# Patient Record
Sex: Male | Born: 1990 | Race: White | Hispanic: No | Marital: Single | Smoking: Former smoker
Health system: Southern US, Community
[De-identification: ages and names within clinical notes are randomized; demographics above are authoritative.]

## PROBLEM LIST (undated history)

## (undated) DIAGNOSIS — F410 Panic disorder [episodic paroxysmal anxiety] without agoraphobia: Secondary | ICD-10-CM

## (undated) DIAGNOSIS — F1910 Other psychoactive substance abuse, uncomplicated: Secondary | ICD-10-CM

## (undated) DIAGNOSIS — R0789 Other chest pain: Secondary | ICD-10-CM

## (undated) DIAGNOSIS — R002 Palpitations: Secondary | ICD-10-CM

## (undated) DIAGNOSIS — Z87891 Personal history of nicotine dependence: Secondary | ICD-10-CM

## (undated) HISTORY — DX: Other psychoactive substance abuse, uncomplicated: F19.10

## (undated) HISTORY — DX: Other chest pain: R07.89

## (undated) HISTORY — DX: Palpitations: R00.2

## (undated) HISTORY — DX: Panic disorder (episodic paroxysmal anxiety): F41.0

## (undated) HISTORY — DX: Personal history of nicotine dependence: Z87.891

---
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
## 2011-05-21 ENCOUNTER — Encounter: Payer: Self-pay | Admitting: *Deleted

## 2011-05-24 ENCOUNTER — Encounter: Payer: Self-pay | Admitting: Cardiovascular Disease

## 2011-05-24 ENCOUNTER — Ambulatory Visit (INDEPENDENT_AMBULATORY_CARE_PROVIDER_SITE_OTHER): Payer: 59 | Admitting: Cardiovascular Disease

## 2011-05-24 DIAGNOSIS — R002 Palpitations: Secondary | ICD-10-CM | POA: Insufficient documentation

## 2011-05-24 DIAGNOSIS — F1910 Other psychoactive substance abuse, uncomplicated: Secondary | ICD-10-CM

## 2011-05-24 DIAGNOSIS — R079 Chest pain, unspecified: Secondary | ICD-10-CM | POA: Insufficient documentation

## 2011-05-24 DIAGNOSIS — F411 Generalized anxiety disorder: Secondary | ICD-10-CM

## 2011-05-24 DIAGNOSIS — F419 Anxiety disorder, unspecified: Secondary | ICD-10-CM | POA: Insufficient documentation
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
## 2011-05-24 NOTE — Assessment & Plan Note (Signed)
Atypical normal exam and ECG.  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

## 2011-05-24 NOTE — Assessment & Plan Note (Signed)
F/U psychiatric counselor consider oral drug Rx

## 2011-05-24 NOTE — Assessment & Plan Note (Signed)
Given multiple previous substances would appear to be high risk for relapse.  F/U primary and psych
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
## 2011-05-24 NOTE — Patient Instructions (Signed)
Your physician has requested that you have an echocardiogram. Echocardiography is a painless test that uses sound waves to create images of your heart. It provides your doctor with information about the size and shape of your heart and how well your heart's chambers and valves are working. This procedure takes approximately one hour. There are no restrictions for this procedure.  Your physician has recommended that you wear an event monitor. Event monitors are medical devices that record the heart's electrical activity. Doctors most often us these monitors to diagnose arrhythmias. Arrhythmias are problems with the speed or rhythm of the heartbeat. The monitor is a small, portable device. You can wear one while you do your normal daily activities. This is usually used to diagnose what is causing palpitations/syncope (passing out).   

## 2011-05-28 ENCOUNTER — Ambulatory Visit (HOSPITAL_COMMUNITY): Payer: 59 | Attending: Cardiovascular Disease | Admitting: Radiology

## 2011-05-28 ENCOUNTER — Encounter (INDEPENDENT_AMBULATORY_CARE_PROVIDER_SITE_OTHER): Payer: 59

## 2011-05-28 DIAGNOSIS — R072 Precordial pain: Secondary | ICD-10-CM | POA: Insufficient documentation

## 2011-05-28 DIAGNOSIS — R002 Palpitations: Secondary | ICD-10-CM | POA: Insufficient documentation

## 2011-05-28 DIAGNOSIS — I059 Rheumatic mitral valve disease, unspecified: Secondary | ICD-10-CM | POA: Insufficient documentation

## 2011-05-28 DIAGNOSIS — I079 Rheumatic tricuspid valve disease, unspecified: Secondary | ICD-10-CM | POA: Insufficient documentation

## 2011-06-03 NOTE — Progress Notes (Signed)
pt aware of results Mario Crawford  

## 2011-06-28 ENCOUNTER — Telehealth: Payer: Self-pay | Admitting: *Deleted

## 2011-06-28 NOTE — Telephone Encounter (Signed)
Spoke with pt, aware monitor reviewed by dr Eden Emms shows sinus with PAC's, no sig arrythmia Mario Crawford

## 2011-07-06 ENCOUNTER — Telehealth: Payer: Self-pay | Admitting: Cardiovascular Disease

## 2011-07-06 NOTE — Telephone Encounter (Signed)
LOV faxed to [facility] @ [phone]   07/06/11/km

## 2011-07-25 ENCOUNTER — Emergency Department (HOSPITAL_COMMUNITY): Payer: 59

## 2011-07-25 ENCOUNTER — Emergency Department (HOSPITAL_COMMUNITY)
Admission: EM | Admit: 2011-07-25 | Discharge: 2011-07-26 | Disposition: A | Discharge status: Home / Self Care | Payer: 59 | Attending: Emergency Medicine | Admitting: Emergency Medicine

## 2011-07-25 DIAGNOSIS — Y998 Other external cause status: Secondary | ICD-10-CM | POA: Insufficient documentation

## 2011-07-25 DIAGNOSIS — F411 Generalized anxiety disorder: Secondary | ICD-10-CM | POA: Insufficient documentation

## 2011-07-25 DIAGNOSIS — R51 Headache: Secondary | ICD-10-CM | POA: Insufficient documentation

## 2011-07-25 DIAGNOSIS — IMO0002 Reserved for concepts with insufficient information to code with codable children: Secondary | ICD-10-CM | POA: Insufficient documentation

## 2011-07-25 DIAGNOSIS — S0990XA Unspecified injury of head, initial encounter: Secondary | ICD-10-CM | POA: Insufficient documentation

## 2011-07-25 DIAGNOSIS — Y92410 Unspecified street and highway as the place of occurrence of the external cause: Secondary | ICD-10-CM | POA: Insufficient documentation

## 2013-01-27 ENCOUNTER — Emergency Department (HOSPITAL_COMMUNITY)
Admission: EM | Admit: 2013-01-27 | Discharge: 2013-01-27 | Disposition: A | Discharge status: Home / Self Care | Payer: 59 | Attending: Emergency Medicine | Admitting: Emergency Medicine

## 2013-01-27 ENCOUNTER — Encounter (HOSPITAL_COMMUNITY): Payer: Self-pay | Admitting: Emergency Medicine

## 2013-01-27 DIAGNOSIS — R21 Rash and other nonspecific skin eruption: Secondary | ICD-10-CM | POA: Insufficient documentation

## 2013-01-27 DIAGNOSIS — F411 Generalized anxiety disorder: Secondary | ICD-10-CM | POA: Insufficient documentation

## 2013-01-27 DIAGNOSIS — R198 Other specified symptoms and signs involving the digestive system and abdomen: Secondary | ICD-10-CM | POA: Insufficient documentation

## 2013-01-27 DIAGNOSIS — R1902 Left upper quadrant abdominal swelling, mass and lump: Secondary | ICD-10-CM | POA: Insufficient documentation

## 2013-01-27 DIAGNOSIS — Z87891 Personal history of nicotine dependence: Secondary | ICD-10-CM | POA: Insufficient documentation

## 2013-01-27 DIAGNOSIS — Z880 Allergy status to penicillin: Secondary | ICD-10-CM | POA: Insufficient documentation

## 2013-01-27 DIAGNOSIS — R0602 Shortness of breath: Secondary | ICD-10-CM | POA: Insufficient documentation

## 2013-01-27 DIAGNOSIS — R079 Chest pain, unspecified: Secondary | ICD-10-CM | POA: Insufficient documentation

## 2013-01-27 DIAGNOSIS — R5381 Other malaise: Secondary | ICD-10-CM | POA: Insufficient documentation

## 2013-01-27 DIAGNOSIS — Z711 Person with feared health complaint in whom no diagnosis is made: Secondary | ICD-10-CM

## 2013-01-27 DIAGNOSIS — Z8679 Personal history of other diseases of the circulatory system: Secondary | ICD-10-CM | POA: Insufficient documentation

## 2013-01-27 DIAGNOSIS — R110 Nausea: Secondary | ICD-10-CM | POA: Insufficient documentation

## 2013-01-27 LAB — CBC WITH DIFFERENTIAL/PLATELET
Eosinophils Absolute: 0.1 10*3/uL (ref 0.0–0.7)
Lymphocytes Relative: 19 % (ref 12–46)
Lymphs Abs: 1.4 10*3/uL (ref 0.7–4.0)
MCH: 31.1 pg (ref 26.0–34.0)
Neutrophils Relative %: 72 % (ref 43–77)
Platelets: 251 10*3/uL (ref 150–400)
RBC: 4.82 MIL/uL (ref 4.22–5.81)
WBC: 7.4 10*3/uL (ref 4.0–10.5)

## 2013-01-27 LAB — COMPREHENSIVE METABOLIC PANEL
ALT: 16 U/L (ref 0–53)
Albumin: 4.5 g/dL (ref 3.5–5.2)
Alkaline Phosphatase: 69 U/L (ref 39–117)
Calcium: 10.5 mg/dL (ref 8.4–10.5)
Potassium: 3.7 mEq/L (ref 3.5–5.1)
Sodium: 140 mEq/L (ref 135–145)
Total Protein: 8.2 g/dL (ref 6.0–8.3)

## 2013-01-27 NOTE — ED Provider Notes (Signed)
History     CSN: [id number]  Arrival date & time 01/27/13  0728   First MD Initiated Contact with Patient 01/27/13 [phone]      Chief Complaint  Patient presents with  . Multiple Complaints   . Wants to be checked for leukemia     (Consider location/radiation/quality/duration/timing/severity/associated sxs/prior treatment) HPI Patient states he thinks he has leukemia because his gums started having gums bleed daily and luq pressure with increased fatigue for several months.  States he found red purple bump luq today.  Denies trauma.  Patient lives in [place] on [place] [facility] from [place].  States he has pmd oot.  States bp is often high when he presents to ed but no bp meds ever given.   Past Medical History  Diagnosis Date  . Palpitations   . Atypical chest pain   . Panic attacks   . Drug abuse   . Former smoker     No past surgical history on file.  No family history on file.  History  Substance Use Topics  . Smoking status: Former Games developer  . Smokeless tobacco: Never Used  . Alcohol Use: No   Occasional etoh,    Review of Systems  Constitutional: Negative.   HENT: Negative for congestion.   Eyes: Negative.   Respiratory: Positive for shortness of breath.   Cardiovascular: Positive for chest pain. Negative for leg swelling.  Gastrointestinal: Positive for nausea. Negative for vomiting and diarrhea.  Endocrine: Negative.   Genitourinary: Negative.   Skin: Positive for rash.  Allergic/Immunologic: Negative.   Neurological: Negative.     Allergies  Penicillins  Home Medications   Current Outpatient Rx  Name  Route  Sig  Dispense  Refill  . acetaminophen (TYLENOL) 325 MG tablet   Oral   Take 650 mg by mouth every 6 (six) hours as needed.             BP 167/88  Pulse 81  Temp(Src) 98.4 F (36.9 C) (Oral)  Resp 18  SpO2 100%  Physical Exam  Nursing note and vitals reviewed. Constitutional: He is oriented to person, place, and time. He appears  well-developed and well-nourished.  HENT:  Head: Normocephalic and atraumatic.  Right Ear: External ear normal.  Left Ear: External ear normal.  Nose: Nose normal.  Mouth/Throat: Oropharynx is clear and moist.  Eyes: Conjunctivae and EOM are normal. Pupils are equal, round, and reactive to light.  Neck: Normal range of motion. Neck supple.  Cardiovascular: Normal rate, regular rhythm, normal heart sounds and intact distal pulses.   Pulmonary/Chest: Effort normal and breath sounds normal.  Abdominal: Soft. Bowel sounds are normal. He exhibits no distension and no mass. There is no tenderness. There is no rebound.  Musculoskeletal: Normal range of motion.  Neurological: He is alert and oriented to person, place, and time.  Skin: Skin is warm and dry. No rash noted.  Psychiatric: His mood appears anxious.    ED Course  Procedures (including critical care time)  Labs Reviewed - No data to display No results found.   No diagnosis found.    MDM  9:12 AM      Hilario Quarry, MD 01/27/13 [phone]

## 2013-01-27 NOTE — ED Notes (Signed)
Pt escorted to discharge window. Verbalized understanding discharge instructions. In no acute distress. Vitals reviewed and WDL.  

## 2013-01-27 NOTE — ED Notes (Signed)
Pt states that he thinks he has leukemia.  States that for the past couple months he has had gum bleeding everyday.  States that nothing provokes it.  States he feels fatigued, has muscle and joint aches.  "I have pressure in my spleen area".  Also states there is a "purple/red dot on his stomach".

## 2014-03-29 ENCOUNTER — Ambulatory Visit: Payer: Self-pay | Admitting: Internal Medicine

## 2014-03-30 LAB — GC/CHLAMYDIA PROBE AMP

## 2018-07-03 ENCOUNTER — Emergency Department (HOSPITAL_COMMUNITY): Payer: Self-pay

## 2018-07-03 ENCOUNTER — Emergency Department (HOSPITAL_COMMUNITY)
Admission: EM | Admit: 2018-07-03 | Discharge: 2018-07-03 | Disposition: A | Discharge status: Home / Self Care | Payer: Self-pay | Attending: Emergency Medicine | Admitting: Emergency Medicine

## 2018-07-03 ENCOUNTER — Encounter (HOSPITAL_COMMUNITY): Payer: Self-pay

## 2018-07-03 DIAGNOSIS — Y939 Activity, unspecified: Secondary | ICD-10-CM | POA: Insufficient documentation

## 2018-07-03 DIAGNOSIS — S1081XA Abrasion of other specified part of neck, initial encounter: Secondary | ICD-10-CM | POA: Insufficient documentation

## 2018-07-03 DIAGNOSIS — Z87891 Personal history of nicotine dependence: Secondary | ICD-10-CM | POA: Insufficient documentation

## 2018-07-03 DIAGNOSIS — Z79899 Other long term (current) drug therapy: Secondary | ICD-10-CM | POA: Insufficient documentation

## 2018-07-03 DIAGNOSIS — S0083XA Contusion of other part of head, initial encounter: Secondary | ICD-10-CM | POA: Insufficient documentation

## 2018-07-03 DIAGNOSIS — Y929 Unspecified place or not applicable: Secondary | ICD-10-CM | POA: Insufficient documentation

## 2018-07-03 DIAGNOSIS — Y999 Unspecified external cause status: Secondary | ICD-10-CM | POA: Insufficient documentation

## 2018-07-03 DIAGNOSIS — S199XXA Unspecified injury of neck, initial encounter: Secondary | ICD-10-CM | POA: Insufficient documentation

## 2018-07-03 DIAGNOSIS — T07XXXA Unspecified multiple injuries, initial encounter: Secondary | ICD-10-CM

## 2018-07-03 LAB — CBC WITH DIFFERENTIAL/PLATELET
BASOS ABS: 0 10*3/uL (ref 0.0–0.1)
BASOS PCT: 0 %
EOS PCT: 1 %
Eosinophils Absolute: 0.2 10*3/uL (ref 0.0–0.7)
HCT: 45.7 % (ref 39.0–52.0)
HEMOGLOBIN: 16.3 g/dL (ref 13.0–17.0)
LYMPHS ABS: 1.6 10*3/uL (ref 0.7–4.0)
Lymphocytes Relative: 12 %
MCH: 31.8 pg (ref 26.0–34.0)
MCHC: 35.7 g/dL (ref 30.0–36.0)
MCV: 89.1 fL (ref 78.0–100.0)
Monocytes Absolute: 0.7 10*3/uL (ref 0.1–1.0)
Monocytes Relative: 5 %
NEUTROS PCT: 82 %
Neutro Abs: 11.5 10*3/uL — ABNORMAL HIGH (ref 1.7–7.7)
PLATELETS: 256 10*3/uL (ref 150–400)
RBC: 5.13 MIL/uL (ref 4.22–5.81)
RDW: 12.1 % (ref 11.5–15.5)
WBC: 14 10*3/uL — AB (ref 4.0–10.5)

## 2018-07-03 LAB — I-STAT CHEM 8, ED
BUN: 8 mg/dL (ref 6–20)
CALCIUM ION: 1.12 mmol/L — AB (ref 1.15–1.40)
Chloride: 103 mmol/L (ref 98–111)
Creatinine, Ser: 1.2 mg/dL (ref 0.61–1.24)
Glucose, Bld: 105 mg/dL — ABNORMAL HIGH (ref 70–99)
HEMATOCRIT: 48 % (ref 39.0–52.0)
Hemoglobin: 16.3 g/dL (ref 13.0–17.0)
POTASSIUM: 3.5 mmol/L (ref 3.5–5.1)
SODIUM: 141 mmol/L (ref 135–145)
TCO2: 24 mmol/L (ref 22–32)

## 2018-07-03 MED ORDER — SODIUM CHLORIDE 0.9 % IJ SOLN
INTRAMUSCULAR | Status: AC
  Filled 2018-07-03: qty 50

## 2018-07-03 MED ORDER — IOPAMIDOL (ISOVUE-370) INJECTION 76%
INTRAVENOUS | Status: AC
  Filled 2018-07-03: qty 100

## 2018-07-03 MED ORDER — DICLOFENAC SODIUM ER 100 MG PO TB24: 100.0000 mg | ORAL_TABLET | Freq: Every day | ORAL | 0 refills | Status: DC

## 2018-07-03 MED ORDER — METHOCARBAMOL 500 MG PO TABS: 500.0000 mg | ORAL_TABLET | Freq: Two times a day (BID) | ORAL | 0 refills | Status: DC

## 2018-07-03 MED ORDER — KETOROLAC TROMETHAMINE 30 MG/ML IJ SOLN
15.0000 mg | Freq: Once | INTRAMUSCULAR | Status: AC
  Administered 2018-07-03: 15 mg via INTRAVENOUS
  Filled 2018-07-03: qty 1

## 2018-07-03 MED ORDER — ACETAMINOPHEN 500 MG PO TABS
1000.0000 mg | ORAL_TABLET | Freq: Once | ORAL | Status: AC
  Administered 2018-07-03: 1000 mg via ORAL
  Filled 2018-07-03: qty 2

## 2018-07-03 MED ORDER — METHOCARBAMOL 500 MG PO TABS
1000.0000 mg | ORAL_TABLET | Freq: Once | ORAL | Status: AC
  Administered 2018-07-03: 1000 mg via ORAL
  Filled 2018-07-03: qty 2

## 2018-07-03 MED ORDER — IOPAMIDOL (ISOVUE-370) INJECTION 76%
100.0000 mL | Freq: Once | INTRAVENOUS | Status: AC | PRN
  Administered 2018-07-03: 100 mL via INTRAVENOUS
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
## 2018-07-03 NOTE — ED Notes (Signed)
[facility] planning/management officer called for pt

## 2019-04-10 ENCOUNTER — Telehealth: Payer: Self-pay | Admitting: General Practice

## 2019-04-10 ENCOUNTER — Telehealth: Payer: Self-pay | Admitting: Family

## 2019-04-10 DIAGNOSIS — Z20822 Contact with and (suspected) exposure to covid-19: Secondary | ICD-10-CM

## 2019-04-10 NOTE — Addendum Note (Signed)
Addended by: Denman George on: 04/10/2019 09:55 AM   Modules accepted: Orders

## 2019-04-10 NOTE — Progress Notes (Signed)
E-Visit for Corona Virus Screening    Your current symptoms could be consistent with the coronavirus.  Call your health care provider or local health department to request and arrange formal testing. Many health care providers can now test patients at their office but not all are.  Please quarantine yourself while awaiting your test results.  [facility] [phone], [facility] [phone], [facility] [phone] or visit [email or web address]    I will have someone call and schedule testing for you. They will be in touch   COVID-19 is a respiratory illness with symptoms that are similar to the flu. Symptoms are typically mild to moderate, but there have been cases of severe illness and death due to the virus. The following symptoms may appear 2-14 days after exposure: . Fever . Cough . Shortness of breath or difficulty breathing . Chills . Repeated shaking with chills . Muscle pain . Headache . Sore throat . New loss of taste or smell . Fatigue . Congestion or runny nose . Nausea or vomiting . Diarrhea  It is vitally important that if you feel that you have an infection such as this virus or any other virus that you stay home and away from places where you may spread it to others.  You should self-quarantine for 14 days if you have symptoms that could potentially be coronavirus or have been in close contact a with a person diagnosed with COVID-19 within the last 2 weeks. You should avoid contact with people age 28 and older.   You should wear a mask or cloth face covering over your nose and mouth if you must be around other people or animals, including pets (even at home). Try to stay at least 6 feet away from other people. This will protect the people around you.  You may also take acetaminophen (Tylenol) as needed for fever.   Reduce your risk of any infection  by using the same precautions used for avoiding the common cold or flu:  Marland Kitchen. Wash your hands often with soap and warm water for at least 20 seconds.  If soap and water are not readily available, use an alcohol-based hand sanitizer with at least 60% alcohol.  . If coughing or sneezing, cover your mouth and nose by coughing or sneezing into the elbow areas of your shirt or coat, into a tissue or into your sleeve (not your hands). . Avoid shaking hands with others and consider head nods or verbal greetings only. . Avoid touching your eyes, nose, or mouth with unwashed hands.  . Avoid close contact with people who are sick. . Avoid places or events with large numbers of people in one location, like concerts or sporting events. . Carefully consider travel plans you have or are making. . If you are planning any travel outside or inside the [place], visit the CDC's Travelers' Health webpage for the latest health notices. . If you have some symptoms but not all symptoms, continue to monitor at home and seek medical attention if your symptoms worsen. . If you are having a medical emergency, call 911.  HOME CARE . Only take medications as instructed by your medical team. . Drink plenty of fluids and get plenty of rest. . A steam or ultrasonic humidifier can help if you have congestion.   GET HELP RIGHT AWAY IF YOU HAVE EMERGENCY WARNING SIGNS** FOR COVID-19. If you or someone is showing any of these signs seek emergency medical care immediately. Call 911  or proceed to your closest emergency facility if: . You develop worsening high fever. . Trouble breathing . Bluish lips or face . Persistent pain or pressure in the chest . New confusion . Inability to wake or stay awake . You cough up blood. . Your symptoms become more severe  **This list is not all possible symptoms. Contact your medical provider for any symptoms that are sever or concerning to you.   MAKE SURE YOU   Understand these  instructions.  Will watch your condition.  Will get help right away if you are not doing well or get worse.  Your e-visit answers were reviewed by a board certified advanced clinical practitioner to complete your personal care plan.  Depending on the condition, your plan could have included both over the counter or prescription medications.  If there is a problem please reply once you have received a response from your provider.  Your safety is important to [place].  If you have drug allergies check your prescription carefully.    You can use MyChart to ask questions about today's visit, request a non-urgent call back, or ask for a work or school excuse for 24 hours related to this e-Visit. If it has been greater than 24 hours you will need to follow up with your provider, or enter a new e-Visit to address those concerns. You will get an e-mail in the next two days asking about your experience.  I hope that your e-visit has been valuable and will speed your recovery. Thank you for using e-visits.   Greater than 5 minutes, yet less than 10 minutes of time have been spent researching, coordinating, and implementing care for this patient today.  Thank you for the details you included in the comment boxes. Those details are very helpful in determining the best course of treatment for you and help [place] to provide the best care.

## 2019-04-10 NOTE — Telephone Encounter (Signed)
Pt has been scheduled for Covid testing. Pt was referred by: Kennyth Arnold, FNP Pt has been scheduled at [facility] testing site.

## 2019-04-11 ENCOUNTER — Other Ambulatory Visit: Payer: Self-pay

## 2019-04-11 ENCOUNTER — Other Ambulatory Visit: Payer: Self-pay | Admitting: Internal Medicine

## 2019-04-11 DIAGNOSIS — Z20822 Contact with and (suspected) exposure to covid-19: Secondary | ICD-10-CM

## 2019-04-14 LAB — NOVEL CORONAVIRUS, NAA: SARS-CoV-2, NAA: NOT DETECTED
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
## 2019-07-18 DIAGNOSIS — Z1159 Encounter for screening for other viral diseases: Secondary | ICD-10-CM | POA: Diagnosis not present

## 2019-07-18 DIAGNOSIS — Z114 Encounter for screening for human immunodeficiency virus [HIV]: Secondary | ICD-10-CM | POA: Diagnosis not present

## 2019-07-18 DIAGNOSIS — N6325 Unspecified lump in the left breast, overlapping quadrants: Secondary | ICD-10-CM | POA: Diagnosis not present

## 2019-07-18 DIAGNOSIS — Z23 Encounter for immunization: Secondary | ICD-10-CM | POA: Diagnosis not present

## 2019-07-24 ENCOUNTER — Ambulatory Visit: Payer: Self-pay | Admitting: Registered Nurse

## 2019-08-03 DIAGNOSIS — J029 Acute pharyngitis, unspecified: Secondary | ICD-10-CM | POA: Diagnosis not present

## 2019-08-03 DIAGNOSIS — Z7189 Other specified counseling: Secondary | ICD-10-CM | POA: Diagnosis not present

## 2019-08-03 DIAGNOSIS — Z03818 Encounter for observation for suspected exposure to other biological agents ruled out: Secondary | ICD-10-CM | POA: Diagnosis not present

## 2019-08-03 DIAGNOSIS — R05 Cough: Secondary | ICD-10-CM | POA: Diagnosis not present

## 2019-08-03 DIAGNOSIS — Z20828 Contact with and (suspected) exposure to other viral communicable diseases: Secondary | ICD-10-CM | POA: Diagnosis not present

## 2019-09-04 DIAGNOSIS — Z03818 Encounter for observation for suspected exposure to other biological agents ruled out: Secondary | ICD-10-CM | POA: Diagnosis not present

## 2019-10-03 ENCOUNTER — Ambulatory Visit: Payer: Self-pay | Attending: Internal Medicine

## 2019-10-04 DIAGNOSIS — Z03818 Encounter for observation for suspected exposure to other biological agents ruled out: Secondary | ICD-10-CM | POA: Diagnosis not present

## 2019-10-12 DIAGNOSIS — R05 Cough: Secondary | ICD-10-CM | POA: Diagnosis not present

## 2019-10-12 DIAGNOSIS — Z03818 Encounter for observation for suspected exposure to other biological agents ruled out: Secondary | ICD-10-CM | POA: Diagnosis not present

## 2019-12-14 ENCOUNTER — Ambulatory Visit: Payer: Self-pay | Attending: Internal Medicine

## 2019-12-14 DIAGNOSIS — Z23 Encounter for immunization: Secondary | ICD-10-CM

## 2019-12-14 NOTE — Progress Notes (Signed)
   Covid-19 Vaccination Clinic  Name:  GRANTLEY SAVAGE    MRN: [id number] DOB: 1991-03-08  12/14/2019  Mr. Deshmukh was observed post Covid-19 immunization for 15 minutes without incident. He was provided with Vaccine Information Sheet and instruction to access the V-Safe system.   Mr. Smiles was instructed to call 911 with any severe reactions post vaccine: Marland Kitchen Difficulty breathing  . Swelling of face and throat  . A fast heartbeat  . A bad rash all over body  . Dizziness and weakness   Immunizations Administered    Name Date Dose VIS Date Route   Pfizer COVID-19 Vaccine 12/14/2019  2:28 PM 0.3 mL 09/07/2019 Intramuscular   Manufacturer: [employer], [employer]   Lot: [id number]   NDC: [address]-1

## 2019-12-18 DIAGNOSIS — R05 Cough: Secondary | ICD-10-CM | POA: Diagnosis not present

## 2020-01-08 ENCOUNTER — Ambulatory Visit: Payer: Self-pay | Attending: Internal Medicine

## 2020-01-08 DIAGNOSIS — Z23 Encounter for immunization: Secondary | ICD-10-CM

## 2020-01-08 NOTE — Progress Notes (Signed)
   Covid-19 Vaccination Clinic  Name:  Mario Crawford    MRN: [id number] DOB: March 10, 1991  01/08/2020  [name] was observed post Covid-19 immunization for 15 minutes without incident. He was provided with Vaccine Information Sheet and instruction to access the V-Safe system.   [name] was instructed to call 911 with any severe reactions post vaccine: Marland Kitchen Difficulty breathing  . Swelling of face and throat  . A fast heartbeat  . A bad rash all over body  . Dizziness and weakness   Immunizations Administered    Name Date Dose VIS Date Route   Pfizer COVID-19 Vaccine 01/08/2020  3:31 PM 0.3 mL 09/07/2019 Intramuscular   Manufacturer: [employer], [employer]   Lot: [id number]   NDC: [address]-8

## 2020-01-17 DIAGNOSIS — D2272 Melanocytic nevi of left lower limb, including hip: Secondary | ICD-10-CM | POA: Diagnosis not present

## 2020-01-17 DIAGNOSIS — L309 Dermatitis, unspecified: Secondary | ICD-10-CM | POA: Diagnosis not present

## 2020-01-17 DIAGNOSIS — D2271 Melanocytic nevi of right lower limb, including hip: Secondary | ICD-10-CM | POA: Diagnosis not present

## 2020-03-01 ENCOUNTER — Ambulatory Visit (HOSPITAL_COMMUNITY)
Admission: EM | Admit: 2020-03-01 | Discharge: 2020-03-01 | Disposition: A | Discharge status: Home / Self Care | Payer: BC Managed Care – PPO | Attending: Family Medicine | Admitting: Family Medicine

## 2020-03-01 ENCOUNTER — Other Ambulatory Visit: Payer: Self-pay

## 2020-03-01 ENCOUNTER — Encounter (HOSPITAL_COMMUNITY): Payer: Self-pay

## 2020-03-01 DIAGNOSIS — M7061 Trochanteric bursitis, right hip: Secondary | ICD-10-CM | POA: Diagnosis not present

## 2020-03-01 MED ORDER — PREDNISONE 10 MG (21) PO TBPK: ORAL_TABLET | Freq: Every day | ORAL | 0 refills | Status: DC

## 2020-03-01 NOTE — ED Triage Notes (Signed)
Pt c/o 4/10 dull righ thip pain and clicking in his right knee. Pt states he started running 3 wks ago for exercise. Pt was able to walk to exam room.

## 2020-03-03 ENCOUNTER — Encounter: Payer: Self-pay | Admitting: Family Medicine

## 2020-03-03 ENCOUNTER — Other Ambulatory Visit: Payer: Self-pay

## 2020-03-03 ENCOUNTER — Ambulatory Visit: Payer: BC Managed Care – PPO | Admitting: Family Medicine

## 2020-03-03 DIAGNOSIS — M25561 Pain in right knee: Secondary | ICD-10-CM | POA: Insufficient documentation

## 2020-03-03 DIAGNOSIS — M25512 Pain in left shoulder: Secondary | ICD-10-CM

## 2020-03-03 NOTE — ED Provider Notes (Signed)
[facility]   [id number] 03/01/20 Arrival Time: 1446  ASSESSMENT & PLAN:  1. Trochanteric bursitis of right hip     No indication for imaging at this time.  Begin: Meds ordered this encounter  Medications  . predniSONE (STERAPRED UNI-PAK 21 TAB) 10 MG (21) TBPK tablet    Sig: Take by mouth daily. Take as directed.    Dispense:  21 tablet    Refill:  0    Recommend: Follow-up Information    [facility].   Why: If worsening or failing to improve as anticipated. Contact information: [address] [place] [address] [phone]          Reviewed expectations re: course of current medical issues. Questions answered. Outlined signs and symptoms indicating need for more acute intervention. Patient verbalized understanding. After Visit Summary given.  SUBJECTIVE: History from: patient. SIMCHA SPEIR is a 29 y.o. male who reports fairly persistent moderate pain of his right hip; described as aching; without radiation. Onset: gradual. First noted: over the past 1-2 weeks; has started running approx 3 weeks ago. No trauma. Symptoms have gradually worsened since beginning. Aggravating factors: have not been identified. Alleviating factors: rest. Associated symptoms: none reported. Extremity sensation changes or weakness: none. Self treatment: NSAID without much relief. History of similar: no. Also has noticed "some clicking" in his R knee; no locking or giving out of knee.  History reviewed. No pertinent surgical history.    OBJECTIVE:  Vitals:   03/01/20 1525 03/01/20 1526  BP: 139/68   Pulse: 62   Resp: 16   Temp: 98.5 F (36.9 C)   TempSrc: Oral   SpO2: 99%   Weight:  81.6 kg  Height:  5\' 7"  (1.702 m)    General appearance: alert; no distress HEENT: [place]; AT Neck: supple with FROM Resp: unlabored respirations Extremities: . RLE: warm with well perfused appearance; fairly well localized  moderate tenderness over right trochanter; without gross deformities; swelling: none; bruising: none; hip ROM: normal Skin: warm and dry; no visible rashes Neurologic: gait normal; normal sensation and strength of RLE Psychological: alert and cooperative; normal mood and affect     Allergies  Allergen Reactions  . Penicillins Anaphylaxis    Has patient had a PCN reaction causing immediate rash, facial/tongue/throat swelling, SOB or lightheadedness with hypotension: Yes Has patient had a PCN reaction causing severe rash involving mucus membranes or skin necrosis: No Has patient had a PCN reaction that required hospitalization: Yes Has patient had a PCN reaction occurring within the last 10 years: No If all of the above answers are "NO", then may proceed with Cephalosporin use.    Past Medical History:  Diagnosis Date  . Atypical chest pain   . Drug abuse (HCC)   . Former smoker   . Palpitations   . Panic attacks    Social History   Socioeconomic History  . Marital status: Single    Spouse name: Not on file  . Number of children: Not on file  . Years of education: Not on file  . Highest education level: Not on file  Occupational History  . Occupation: : [employer]  Tobacco Use  . Smoking status: Former Dentist  . Smokeless tobacco: Never Used  Substance and Sexual Activity  . Alcohol use: Yes    Comment: occ  . Drug use: No    Types: Marijuana    Comment: use to use marijana  .  Sexual activity: Not on file  Other Topics Concern  . Not on file  Social History Narrative   Single   Mother with him today good support   Father in [place] at [employer]   Has psychiatric counseling   Currently denies drug use and cigarette use   Social Determinants of Health   Financial Resource Strain:   . Difficulty of Paying Living Expenses:   Food Insecurity:   . Worried About Charity fundraiser in the Last Year:   . Arboriculturist  in the Last Year:   Transportation Needs:   . Film/video editor (Medical):   Marland Kitchen Lack of Transportation (Non-Medical):   Physical Activity:   . Days of Exercise per Week:   . Minutes of Exercise per Session:   Stress:   . Feeling of Stress :   Social Connections:   . Frequency of Communication with Friends and Family:   . Frequency of Social Gatherings with Friends and Family:   . Attends Religious Services:   . Active Member of Clubs or Organizations:   . Attends Archivist Meetings:   Marland Kitchen Marital Status:    Family History  Problem Relation Age of Onset  . Healthy Father    History reviewed. No pertinent surgical history.    Vanessa Kick, MD 03/03/20 [phone]

## 2020-03-03 NOTE — Patient Instructions (Signed)
Your exam is reassuring. If you want to do an MRI of your right knee, let us know and we will get approval for this. You have patellofemoral syndrome. Avoid painful activities when possible (often deep squats, lunges, hills bother this). Cross train with swimming, cycling with low resistance, elliptical if needed. Straight leg raise, hip side raises, straight leg raises with foot turned outwards 3 sets of 10 once a day. Add ankle weight if these become too easy. Consider formal physical therapy. Correct foot breakdown with something like dr. Jari Sportsman active series, spencos, superfeet, or our green sports insoles. Avoid flat shoes, barefoot walking as much as possible. Icing 15 minutes at a time 3-4 times a day as needed. Tylenol or ibuprofen as needed for pain. Follow up with me in 4 weeks before you leave.  You have rotator cuff impingement (specifically of your infraspinatus muscle) Try to avoid painful activities (overhead activities, lifting with extended arm) as much as possible. Aleve 2 tabs twice a day with food OR ibuprofen 3 tabs three times a day with food for pain and inflammation. Can take tylenol in addition to this. Do home exercise program with theraband and scapular stabilization exercises daily 3 sets of 10 once a day.

## 2020-03-03 NOTE — Assessment & Plan Note (Signed)
Special testing showing possible infraspinatus impingement.  Advised home exercise program.  Unlikely rotator cuff tear given young age.  Follow-up if no improvement.

## 2020-03-03 NOTE — Progress Notes (Signed)
Mario Crawford is a 29 y.o. male who presents to [facility] today for the following:  Right knee pain Seen in UC on 6/5 and diagnosed with trochanteric bursitis of right hip. Given RX for prednisone.  Patient reports he was recently accepted to the [employer].  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  Has been starting an aggressive exercise routine to help get him self prepared.  Has been increasing his running, jogging, walking.  Was pushing himself to do at least 1 to 2-1/2 miles a day.  4 to 5 days ago he started to notice right hip pain.  Now it is in his right knee and it is constant.  He is unable to point to a specific spot but states it is all over the anterior knee.  Has full range of motion but does feel stiff.  In the urgent care he was given prednisone and he is on day 2 now, states is not helping.  Has been using 2 Advil's in the morning and 2 Advil in the night.  Denies any locking, clicking, instability.  Does report some popping but this seems chronic.  Does report that his leg feels somewhat weaker.  Denies any numbness or tingling.  Denies any limping.  Is able to ambulate as normal.  Left shoulder pain Does report some shoulder pain after doing more push-ups.  He is not dropping objects or having weakness.  PMH reviewed. none ROS as above. Medications reviewed.  Exam:  BP 132/84    Ht 5\' 7"  (1.702 m)    Wt 178 lb (80.7 kg)    BMI 27.88 kg/m  Gen: Well NAD MSK: Knee: - Inspection: no gross deformity. No swelling/effusion, erythema or bruising. Skin intact - Palpation: no TTP - ROM: full active ROM with flexion and extension in knee and hip - Strength: 5/5 strength - Neuro/vasc: NV intact - Special Tests: - LIGAMENTS: negative anterior and posterior drawer, negative Lachman's, no MCL or LCL laxity  -- MENISCUS: negative McMurray's, negative Thessaly, negative Apley -- PF JOINT: nml patellar mobility bilaterally.  negative patellar grind, negative patellar  apprehension  Hips: normal ROM, negative FABER and FADIR bilaterally  Foot: Inspection:  No obvious bony deformity.  No swelling, erythema, or bruising.  Flattened longitudinal arch with pronation  Palpation: No tenderness to palpation ROM: Full  ROM of the ankle. Normal midfoot flexibility Strength: 5/5 strength ankle in all planes Neurovascular: N/V intact distally in the lower extremity  Shoulder: Inspection reveals no obvious deformity, atrophy, or asymmetry. No bruising. No swelling Palpation is normal with no TTP over [facility] joint or bicipital groove. Full ROM in flexion, abduction, internal/external rotation NV intact distally Normal scapular function observed. Special Tests:  - Impingement: Positive Hawkins, Neg neers, Neg empty can sign. - Supraspinatous: Negative empty can.  5/5 strength with resisted flexion at 20 degrees - Infraspinatous/Teres Minor: 5/5 strength with ER but pain with ER - Subscapularis: 5/5 strength with IR - No painful arc and no drop arm sign  Assessment and Plan: 1) Right knee pain Patient with acute right knee pain.  Likely overuse injury as patient has started aggressive exercise routine.  Normal exam and normal special testing.  Unlikely meniscal injury given negative McMurray's and Thessaly's.  Also no effusion or edema.  Full range of motion.  Does have pes planus which is likely contributing.  Also with pronation.  Advised orthotic use.  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  Discussed over-the-counter inserts versus green inserts.  Will consider physical therapy if no improvement.  Patient expressed desire for MRI.  We discussed negative special testing but will order MRI if patient desires.  Patient will call back to let us know if he would like imaging prior to [employer] training camp.  Did advise to do home exercise program to help strengthen knee  Left  shoulder pain Special testing showing possible infraspinatus impingement.  Advised home exercise program.  Unlikely rotator cuff tear given young age.  Follow-up if no improvement.   Dalphine Handing, PGY-3 [facility] Resident 03/03/2020 3:08 PM

## 2020-03-03 NOTE — Assessment & Plan Note (Addendum)
Patient with acute right knee pain.  Likely overuse injury as patient has started aggressive exercise routine.  Normal exam and normal special testing.  Unlikely meniscal injury given negative McMurray's and Thessaly's.  Also no effusion or edema.  Full range of motion.  Does have pes planus which is likely contributing.  Also with pronation.  Advised orthotic use.  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  Discussed over-the-counter inserts versus green inserts.  Patient will try over-the-counter at this time.  Will consider physical therapy if no improvement.  Patient expressed desire for MRI.  We discussed negative special testing but will order MRI if patient desires.  Patient will call back to let us know if he would like imaging prior to [employer] training camp.  Did advise to do home exercise program to help strengthen knee

## 2020-03-04 ENCOUNTER — Ambulatory Visit: Payer: BC Managed Care – PPO | Admitting: Sports Medicine

## 2020-03-17 ENCOUNTER — Ambulatory Visit: Payer: Self-pay | Admitting: Physician Assistant

## 2020-03-26 ENCOUNTER — Other Ambulatory Visit: Payer: Self-pay

## 2020-03-26 ENCOUNTER — Ambulatory Visit (INDEPENDENT_AMBULATORY_CARE_PROVIDER_SITE_OTHER): Payer: BC Managed Care – PPO | Admitting: Family Medicine

## 2020-03-26 VITALS — BP 142/86 | Ht 67.0 in | Wt 175.0 lb

## 2020-03-26 DIAGNOSIS — M25512 Pain in left shoulder: Secondary | ICD-10-CM

## 2020-03-26 DIAGNOSIS — M79605 Pain in left leg: Secondary | ICD-10-CM

## 2020-03-26 DIAGNOSIS — M25561 Pain in right knee: Secondary | ICD-10-CM | POA: Diagnosis not present

## 2020-03-26 DIAGNOSIS — M79604 Pain in right leg: Secondary | ICD-10-CM | POA: Diagnosis not present

## 2020-03-26 NOTE — Patient Instructions (Signed)
Your shoulder is great and your knee is improving. Continue with the exercises for your knee until this has resolved. The exam and ultrasound of your shins are reassuring. Take tylenol and/or ibuprofen as needed for pain. Icing 3-4 times a day and after activity for 15 minutes at a time Orthotics or shoe inserts (spencos, sports insoles, dr. Jari Sportsman active series) may be helpful if shin pain worsens. Follow up with me as needed.

## 2020-03-27 ENCOUNTER — Encounter: Payer: Self-pay | Admitting: Family Medicine

## 2020-03-27 NOTE — Progress Notes (Signed)
PCP: Patient, No Pcp Per  Subjective:   HPI: Patient is a 29 y.o. male here for right knee, left shoulder pain.  6/7: Right knee pain Seen in UC on 6/5 and diagnosed with trochanteric bursitis of right hip. Given RX for prednisone.  Patient reports he was recently accepted to the [employer].  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  Has been starting an aggressive exercise routine to help get him self prepared.  Has been increasing his running, jogging, walking.  Was pushing himself to do at least 1 to 2-1/2 miles a day.  4 to 5 days ago he started to notice right hip pain.  Now it is in his right knee and it is constant.  He is unable to point to a specific spot but states it is all over the anterior knee.  Has full range of motion but does feel stiff.  In the urgent care he was given prednisone and he is on day 2 now, states is not helping.  Has been using 2 Advil's in the morning and 2 Advil in the night.  Denies any locking, clicking, instability.  Does report some popping but this seems chronic.  Does report that his leg feels somewhat weaker.  Denies any numbness or tingling.  Denies any limping.  Is able to ambulate as normal.  Left shoulder pain Does report some shoulder pain after doing more push-ups.  He is not dropping objects or having weakness.  6/30: Patient reports his shoulder is better without any issues. His right knee is also improving with home exercises Wearing insoles which have helped also. He's running about 1-2 miles at a time. Having some discomfort bilateral medial shins with running.  Past Medical History:  Diagnosis Date  . Atypical chest pain   . Drug abuse (HCC)   . Former smoker   . Palpitations   . Panic attacks     No current outpatient medications on file prior to visit.   No current facility-administered medications on file prior to visit.    History reviewed. No pertinent surgical history.  Allergies  Allergen Reactions   . Penicillins Anaphylaxis    Has patient had a PCN reaction causing immediate rash, facial/tongue/throat swelling, SOB or lightheadedness with hypotension: Yes Has patient had a PCN reaction causing severe rash involving mucus membranes or skin necrosis: No Has patient had a PCN reaction that required hospitalization: Yes Has patient had a PCN reaction occurring within the last 10 years: No If all of the above answers are "NO", then may proceed with Cephalosporin use.    Social History   Socioeconomic History  . Marital status: Single    Spouse name: Not on file  . Number of children: Not on file  . Years of education: Not on file  . Highest education level: Not on file  Occupational History  . Occupation: Dentist: [employer]  Tobacco Use  . Smoking status: Former Games developer  . Smokeless tobacco: Never Used  Substance and Sexual Activity  . Alcohol use: Yes    Comment: occ  . Drug use: No    Types: Marijuana    Comment: use to use marijana  . Sexual activity: Not on file  Other Topics Concern  . Not on file  Social History Narrative   Single   Mother with him today good support   Father in [place] at [employer] studying film   Has psychiatric counseling  Currently denies drug use and cigarette use   Social Determinants of Health   Financial Resource Strain:   . Difficulty of Paying Living Expenses:   Food Insecurity:   . Worried About Programme researcher, broadcasting/film/video in the Last Year:   . Barista in the Last Year:   Transportation Needs:   . Freight forwarder (Medical):   Marland Kitchen Lack of Transportation (Non-Medical):   Physical Activity:   . Days of Exercise per Week:   . Minutes of Exercise per Session:   Stress:   . Feeling of Stress :   Social Connections:   . Frequency of Communication with Friends and Family:   . Frequency of Social Gatherings with Friends and Family:   . Attends Religious Services:   . Active Member of Clubs or  Organizations:   . Attends Banker Meetings:   Marland Kitchen Marital Status:   Intimate Partner Violence:   . Fear of Current or Ex-Partner:   . Emotionally Abused:   Marland Kitchen Physically Abused:   . Sexually Abused:     Family History  Problem Relation Age of Onset  . Healthy Father     BP (!) 142/86   Ht 5\' 7"  (1.702 m)   Wt 175 lb (79.4 kg)   BMI 27.41 kg/m   Review of Systems: See HPI above.     Objective:  Physical Exam:  Gen: NAD, comfortable in exam room  Left shoulder: No swelling, ecchymoses.  No gross deformity. No TTP. FROM. Negative Hawkins, Neers. Strength 5/5 with empty can and resisted internal/external rotation. NV intact distally.  Right knee: No gross deformity, ecchymoses, swelling. No TTP. FROM. Negative ant/post drawers. Negative valgus/varus testing. Negative lachmans. Negative mcmurrays, apleys. NV intact distally.  Bilateral lower legs: No deformity. FROM with 5/5 strength ankles. Minimal tenderness to palpation medial tibial border about middle 1/3rd of tibias. NVI distally. Negative hop test bilaterally.  MSK u/s:  No cortical irregularity, edema overlying cortex, neovascularity of bilateral tibias.   Assessment & Plan:  1. Right knee pain: 2/2 patellofemoral syndrome.  Improving and exam is reassuring.  Continue with home exercises until this has resolved.  Tylenol, ibuprofen if needed.  Icing if needed.  Continue with inserts.  2. Bilateral shin pain: mild shin splints.  Ultrasound reassuring.  Icing, stretches and lower extremity strengthening, arch supports.  3. Left shoulder pain: resolved.

## 2020-10-30 DIAGNOSIS — Z20828 Contact with and (suspected) exposure to other viral communicable diseases: Secondary | ICD-10-CM | POA: Diagnosis not present

## 2021-03-21 DIAGNOSIS — R059 Cough, unspecified: Secondary | ICD-10-CM | POA: Diagnosis not present

## 2021-04-06 ENCOUNTER — Encounter (HOSPITAL_COMMUNITY): Payer: Self-pay

## 2021-04-06 ENCOUNTER — Emergency Department (HOSPITAL_COMMUNITY): Payer: BC Managed Care – PPO

## 2021-04-06 ENCOUNTER — Ambulatory Visit (HOSPITAL_COMMUNITY)
Admission: EM | Admit: 2021-04-06 | Discharge: 2021-04-06 | Disposition: A | Discharge status: Home / Self Care | Payer: BC Managed Care – PPO | Attending: Internal Medicine | Admitting: Internal Medicine

## 2021-04-06 ENCOUNTER — Emergency Department (HOSPITAL_COMMUNITY)
Admission: EM | Admit: 2021-04-06 | Discharge: 2021-04-06 | Disposition: A | Discharge status: Home / Self Care | Payer: BC Managed Care – PPO | Attending: Emergency Medicine | Admitting: Emergency Medicine

## 2021-04-06 ENCOUNTER — Other Ambulatory Visit: Payer: Self-pay

## 2021-04-06 DIAGNOSIS — R0789 Other chest pain: Secondary | ICD-10-CM | POA: Diagnosis not present

## 2021-04-06 DIAGNOSIS — R200 Anesthesia of skin: Secondary | ICD-10-CM | POA: Insufficient documentation

## 2021-04-06 DIAGNOSIS — R202 Paresthesia of skin: Secondary | ICD-10-CM | POA: Diagnosis not present

## 2021-04-06 DIAGNOSIS — R9431 Abnormal electrocardiogram [ECG] [EKG]: Secondary | ICD-10-CM | POA: Diagnosis not present

## 2021-04-06 DIAGNOSIS — Z87891 Personal history of nicotine dependence: Secondary | ICD-10-CM | POA: Insufficient documentation

## 2021-04-06 DIAGNOSIS — F410 Panic disorder [episodic paroxysmal anxiety] without agoraphobia: Secondary | ICD-10-CM

## 2021-04-06 DIAGNOSIS — I10 Essential (primary) hypertension: Secondary | ICD-10-CM | POA: Diagnosis not present

## 2021-04-06 DIAGNOSIS — R079 Chest pain, unspecified: Secondary | ICD-10-CM | POA: Diagnosis not present

## 2021-04-06 LAB — CBC WITH DIFFERENTIAL/PLATELET
Abs Immature Granulocytes: 0.02 10*3/uL (ref 0.00–0.07)
Basophils Absolute: 0.1 10*3/uL (ref 0.0–0.1)
Basophils Relative: 1 %
Eosinophils Absolute: 0.2 10*3/uL (ref 0.0–0.5)
Eosinophils Relative: 3 %
HCT: 40.8 % (ref 39.0–52.0)
Hemoglobin: 14 g/dL (ref 13.0–17.0)
Immature Granulocytes: 0 %
Lymphocytes Relative: 31 %
Lymphs Abs: 1.9 10*3/uL (ref 0.7–4.0)
MCH: 31.4 pg (ref 26.0–34.0)
MCHC: 34.3 g/dL (ref 30.0–36.0)
MCV: 91.5 fL (ref 80.0–100.0)
Monocytes Absolute: 0.7 10*3/uL (ref 0.1–1.0)
Monocytes Relative: 12 %
Neutro Abs: 3.1 10*3/uL (ref 1.7–7.7)
Neutrophils Relative %: 53 %
Platelets: 205 10*3/uL (ref 150–400)
RBC: 4.46 MIL/uL (ref 4.22–5.81)
RDW: 12.5 % (ref 11.5–15.5)
WBC: 5.9 10*3/uL (ref 4.0–10.5)
nRBC: 0 % (ref 0.0–0.2)

## 2021-04-06 LAB — COMPREHENSIVE METABOLIC PANEL
ALT: 23 U/L (ref 0–44)
AST: 28 U/L (ref 15–41)
Albumin: 4 g/dL (ref 3.5–5.0)
Alkaline Phosphatase: 52 U/L (ref 38–126)
Anion gap: 8 (ref 5–15)
BUN: 11 mg/dL (ref 6–20)
CO2: 29 mmol/L (ref 22–32)
Calcium: 9.4 mg/dL (ref 8.9–10.3)
Chloride: 103 mmol/L (ref 98–111)
Creatinine, Ser: 0.86 mg/dL (ref 0.61–1.24)
GFR, Estimated: >60 mL/min (ref >60)
Glucose, Bld: 111 mg/dL — ABNORMAL HIGH (ref 70–99)
Potassium: 4.3 mmol/L (ref 3.5–5.1)
Sodium: 140 mmol/L (ref 135–145)
Total Bilirubin: 0.9 mg/dL (ref 0.3–1.2)
Total Protein: 7.5 g/dL (ref 6.5–8.1)

## 2021-04-06 LAB — TROPONIN I (HIGH SENSITIVITY)
Troponin I (High Sensitivity): 4 ng/L (ref <18)
Troponin I (High Sensitivity): 3 ng/L (ref <18)

## 2021-04-06 NOTE — ED Notes (Signed)
Ignacia Bayley, PA into triage to assess pt status

## 2021-04-06 NOTE — ED Provider Notes (Signed)
[facility] EMERGENCY DEPARTMENT Provider Note   CSN: [id number] Arrival date & time: 04/06/21  1134     History Chief Complaint  Patient presents with   arm numbness    Mario Crawford is a 30 y.o. male.  HPI  30 year old male PMHx atypical chest pain, drug use, palpitations, panic attacks, referred from urgent care for LUE numbness and weakness.  Onset was upon waking up this morning at 0900, affecting radial aspect of forearm extending distally to thumb and index finger, and proximally to distal humerus region.  Symptoms are constant, no apparent modifying factors.  He additionally had brief bout of lightheadedness and nausea consistent with remote history of anxiety with panic attacks that began approximately 1015 and resolved spontaneously shortly thereafter.  He was evaluated in urgent care where he was noted to have a TWI in lead III prompting referral for cardiac work-up after engaging in shared decision-making with the patient.  Patient has no prior history of MI, stroke, DM, HTN, HLD.  No family history of CAD less than 64 years old.  No further medical concerns at this time including fevers, chills, diaphoresis, shortness of breath, cough, chest pain, palpitations, abdominal pain, vomiting, bowel/bladder changes, syncope, seizures, headache.  History obtained from patient and chart review.     Past Medical History:  Diagnosis Date   Atypical chest pain    Drug abuse (HCC)    Former smoker    Palpitations    Panic attacks     Patient Active Problem List   Diagnosis Date Noted   Right knee pain 03/03/2020   Left shoulder pain 03/03/2020   Palpitations 05/24/2011   Chest pain 05/24/2011   Anxiety 05/24/2011   Drug abuse (HCC) 05/24/2011    No past surgical history on file.     Family History  Problem Relation Age of Onset   Healthy Father     Social History   Tobacco Use   Smoking status: Former    Pack years: 0.00   Smokeless  tobacco: Never  Substance Use Topics   Alcohol use: Yes    Comment: occ   Drug use: No    Types: Marijuana    Comment: use to use marijana    Home Medications Prior to Admission medications   Not on File    Allergies    Penicillins  Review of Systems   Review of Systems  All other systems reviewed and are negative.  Physical Exam Updated Vital Signs BP 115/82   Pulse 64   Temp 98.3 F (36.8 C) (Oral)   Resp 19   Ht 5\' 7"  (1.702 m)   Wt 77.1 kg   SpO2 98%   BMI 26.63 kg/m   Physical Exam Vitals and nursing note reviewed.  Constitutional:      General: He is not in acute distress.    Appearance: Normal appearance. He is not toxic-appearing.  HENT:     Head: Normocephalic and atraumatic.     Nose: Nose normal.     Mouth/Throat:     Mouth: Mucous membranes are moist.     Pharynx: Oropharynx is clear.  Eyes:     Extraocular Movements: Extraocular movements intact.     Conjunctiva/sclera: Conjunctivae normal.     Pupils: Pupils are equal, round, and reactive to light.  Cardiovascular:     Rate and Rhythm: Normal rate and regular rhythm.     Heart sounds: No murmur heard.   No friction rub. No  gallop.  Pulmonary:     Effort: Pulmonary effort is normal.     Breath sounds: Normal breath sounds. No stridor. No wheezing, rhonchi or rales.  Abdominal:     General: Abdomen is flat. There is no distension.     Palpations: Abdomen is soft.     Tenderness: There is no abdominal tenderness. There is no right CVA tenderness, left CVA tenderness, guarding or rebound.  Musculoskeletal:        General: No deformity or signs of injury.     Cervical back: Normal range of motion and neck supple. No rigidity or tenderness.     Right lower leg: No edema.     Left lower leg: No edema.  Skin:    General: Skin is warm and dry.  Neurological:     Mental Status: He is alert and oriented to person, place, and time.     Comments: Mental status: a&o x4 Speech: clear, no  dysarthria CN II: visual fields grossly intact CN III/IV/VI: PERRL, EOMI CN V: facial sensation to LT and mastication intact CN VII: no facial droop CN VIII: no nystagmus, audition grossly intact VN IX/X: swallow intact CN XI: trapezius and SCM motor function intact CN XII: midline tongue w/o atrophy or fasciculation RUE: 5/5 strength, sensation to LT intact LUE: 5/5 strength, sensation to LT decreased over lateral aspect distal humerus, radial aspect of forearm, extending to both palmar and dorsal aspects of digits 1 and 2 RLE: 5/5 strength, sensation to LT intact LLE: 5/5 strength, sensation to LT intact Coordination: finger-to-nose, foot alternation intact. No dysdiadochokinesia Gait: Untested   Psychiatric:        Mood and Affect: Mood normal.        Behavior: Behavior normal.        Thought Content: Thought content normal.        Judgment: Judgment normal.    ED Results / Procedures / Treatments   Labs (all labs ordered are listed, but only abnormal results are displayed) Labs Reviewed  COMPREHENSIVE METABOLIC PANEL - Abnormal; Notable for the following components:      Result Value   Glucose, Bld 111 (*)    All other components within normal limits  CBC WITH DIFFERENTIAL/PLATELET  TROPONIN I (HIGH SENSITIVITY)  TROPONIN I (HIGH SENSITIVITY)    EKG None  Radiology DG Chest 1 View  Result Date: 04/06/2021 CLINICAL DATA:  LEFT arm numbness, chest pain in a 30 year old male. EXAM: CHEST  1 VIEW COMPARISON:  July 03, 2018. FINDINGS: Trachea midline. Cardiomediastinal contours and hilar structures are normal. Lungs are clear.  No sign of effusion.  No pneumothorax. EKG stickers project over the chest. On limited assessment no acute skeletal process IMPRESSION: No acute cardiopulmonary disease. Electronically Signed   By: Donzetta Kohut M.D.   On: 04/06/2021 12:45    Procedures Procedures   Medications Ordered in ED Medications - No data to display  ED Course  I  have reviewed the triage vital signs and the nursing notes.  Pertinent labs & imaging results that were available during my care of the patient were reviewed by me and considered in my medical decision making (see chart for details).  Clinical Course as of 04/07/21 0412  Mon Apr 06, 2021  1645 Discussed during the head CT with the patient.  He has now decided that he does not want to have that done.  I discussed with him the reason we were doing the head CT.  He understands and  will follow up with his doctor if the symptoms persist.  Overall he is at low risk for stroke or TIA, tumor. [JK]    Clinical Course User Index [JK] Linwood Dibbles, MD   MDM Rules/Calculators/A&P                          This is a 30 year old male with PMHx anxiety and panic attacks (remote, no medication), presenting for LUE weakness/paresthesias over ulnar aspect upon waking up this morning approximately 0900 AM.  Additionally had brief panic attack-like symptoms approximately 1015 that spontaneously resolved.  No other concerns or risk factors noted.  On exam, decreased sensation to light touch over aforementioned distribution, otherwise neuro intact and full strength.  DDx included: Stroke, seizure, anxiety, conversion disorder, arrhythmia, metabolic derangement, peripheral nerve injury  All studies independently reviewed by myself, d/w the attending physician, factored into my MDM. 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  Given age and lack of risk factors, low degree of suspicion for stroke.  Presentation consistent with classic  seizure semiology's, no postictal state.  EKG reassuring against arrhythmia.  Labs reassuring against metabolic derangement.  Therefore, feel the patient is safe for discharge home with close outpatient follow-up.  CT head was offered in the context of his localized findings to further reassure against intracranial pathology.  In engaging in shared decision-making with the patient, he decided that he would follow-up with his physician if his symptoms persisted and he declined the CT.  Strict return precautions were provided.  He understands and agrees with this plan.  Patient HDS, nontoxic-appearing, ambulatory on reevaluation; subsequently discharged.  Final Clinical Impression(s) / ED Diagnoses Final diagnoses:  LUE numbness    Rx / DC Orders ED Discharge Orders     None        Colvin Caroli, MD 04/07/21 [id number]    Linwood Dibbles, MD 04/07/21 1016

## 2021-04-06 NOTE — ED Triage Notes (Addendum)
Pt here from urgent care with left arm numbness, CP, slight nausea. Denies dizziness during triage. Symptoms began at 0900

## 2021-04-06 NOTE — ED Provider Notes (Signed)
[facility]    CSN: [id number] Arrival date & time: 04/06/21  1043      History   Chief Complaint Chief Complaint  Patient presents with   Numbness   Dizziness   Chest Pain    HPI Mario Crawford is a 30 y.o. male presenting with few hours of left arm numbness, chest pain, nausea without vomiting, dizziness.  Medical history panic attacks.  States that he woke up like this.  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  States that he is having the left-sided chest pain constantly, but worse with movement and deep inspiration.  Also with left arm pain and numbness in the fingers, denies exacerbation of this with movement.  Endorses 30 minutes of constant dizziness and lightheadedness, without headaches or vision changes.  He states this feels similar to his panic attacks in the past, though he has not had a panic attack in years.  Denies nausea, vomiting, jaw pain, syncope, weakness in arms or legs.  HPI  Past Medical History:  Diagnosis Date   Atypical chest pain    Drug abuse (HCC)    Former smoker    Palpitations    Panic attacks     Patient Active Problem List   Diagnosis Date Noted   Right knee pain 03/03/2020   Left shoulder pain 03/03/2020   Palpitations 05/24/2011   Chest pain 05/24/2011   Anxiety 05/24/2011   Drug abuse (HCC) 05/24/2011    History reviewed. No pertinent surgical history.     Home Medications    Prior to Admission medications   Not on File    Family History Family History  Problem Relation Age of Onset   Healthy Father     Social History Social History   Tobacco Use   Smoking status: Former    Pack years: 0.00   Smokeless tobacco: Never  Substance Use Topics   Alcohol use: Yes    Comment: occ   Drug use: No    Types: Marijuana    Comment: use to use marijana     Allergies   Penicillins   Review of Systems Review of Systems  Constitutional:  Negative for appetite change, chills, fatigue and fever.   HENT:  Negative for congestion, sinus pressure, sore throat, trouble swallowing and voice change.   Eyes:  Negative for photophobia, pain, discharge, redness, itching and visual disturbance.  Respiratory:  Positive for chest tightness. Negative for cough and shortness of breath.   Cardiovascular:  Positive for chest pain. Negative for palpitations and leg swelling.  Gastrointestinal:  Negative for abdominal pain, constipation, diarrhea, nausea and vomiting.  Genitourinary:  Negative for dysuria, flank pain, frequency and urgency.  Musculoskeletal:  Negative for back pain, gait problem, myalgias, neck pain and neck stiffness.  Neurological:  Positive for dizziness and light-headedness. Negative for tremors, seizures, syncope, facial asymmetry, speech difficulty, weakness, numbness and headaches.  Psychiatric/Behavioral:  Negative for agitation, decreased concentration, dysphoric mood, hallucinations and suicidal ideas. The patient is not nervous/anxious.   All other systems reviewed and are negative.   Physical Exam Triage Vital Signs ED Triage Vitals  Enc Vitals Group     BP 04/06/21 1100 128/76     Pulse Rate 04/06/21 1100 83     Resp 04/06/21 1100 18     Temp 04/06/21 1100 98.6 F (37 C)     Temp Source 04/06/21 1100 Oral     SpO2 04/06/21 1100 100 %     Weight --  Height --      Head Circumference --      Peak Flow --      Pain Score 04/06/21 1059 2     Pain Loc --      Pain Edu? --      Excl. in GC? --    No data found.  Updated Vital Signs BP 128/76 (BP Location: Right Arm)   Pulse 83   Temp 98.6 F (37 C) (Oral)   Resp 18   SpO2 100%   Visual Acuity Right Eye Distance:   Left Eye Distance:   Bilateral Distance:    Right Eye Near:   Left Eye Near:    Bilateral Near:     Physical Exam Vitals reviewed.  Constitutional:      General: He is not in acute distress.    Appearance: Normal appearance. He is not ill-appearing.  HENT:     Head: Normocephalic  and atraumatic.  Eyes:     Extraocular Movements: Extraocular movements intact.     Pupils: Pupils are equal, round, and reactive to light.  Cardiovascular:     Rate and Rhythm: Normal rate and regular rhythm.     Heart sounds: Normal heart sounds.  Pulmonary:     Effort: Pulmonary effort is normal.     Breath sounds: Normal breath sounds. No wheezing, rhonchi or rales.  Chest:     Chest wall: Tenderness present.     Comments: Reproducible left chest wall tenderness  Musculoskeletal:     Cervical back: Normal range of motion and neck supple. No rigidity.     Comments: No reproducible L arm pain. Sensation and strength intact.  Lymphadenopathy:     Cervical: No cervical adenopathy.  Skin:    Capillary Refill: Capillary refill takes less than 2 seconds.  Neurological:     General: No focal deficit present.     Mental Status: He is alert and oriented to person, place, and time. Mental status is at baseline.     Cranial Nerves: Cranial nerves are intact. No cranial nerve deficit or facial asymmetry.     Sensory: Sensation is intact. No sensory deficit.     Motor: Motor function is intact. No weakness.     Coordination: Coordination is intact. Coordination normal.     Gait: Gait is intact. Gait normal.     Comments: CN 2-12 intact. No weakness or numbness in UEs or LEs.  Psychiatric:        Mood and Affect: Mood normal.        Behavior: Behavior normal.        Thought Content: Thought content normal.        Judgment: Judgment normal.     UC Treatments / Results  Labs (all labs ordered are listed, but only abnormal results are displayed) Labs Reviewed - No data to display  EKG   Radiology No results found.  Procedures Procedures (including critical care time)  Medications Ordered in UC Medications - No data to display  Initial Impression / Assessment and Plan / UC Course  I have reviewed the triage vital signs and the nursing notes.  Pertinent labs & imaging results  that were available during my care of the patient were reviewed by me and considered in my medical decision making (see chart for details).     This patient is a very pleasant 30 y.o. year old male presenting with suspected panic attack and costochondritis. Chest pain is reproducible. EKG with inverted t-wave  in lead III. Discussed that I cannot rule out cardiac pathology at this urgent care, though I suspect this symptoms are MSK in nature, with concurrent panic attack. Patient states he wishes to proceed to ED. he is hemodynamically stable for transport in personal vehicle at this time.  Final Clinical Impressions(s) / UC Diagnoses   Final diagnoses:  Chest wall pain  Abnormal electrocardiogram (ECG) (EKG)  Panic attack   Discharge Instructions   None    ED Prescriptions   None    PDMP not reviewed this encounter.   Rhys Martini, PA-C 04/06/21 1257

## 2021-04-06 NOTE — Discharge Instructions (Addendum)
You were evaluated for your left arm numbness.  Our exam was reassuring against any significantly threatening pathology.  You should follow-up with your primary care doctor next couple days for reevaluation further management.  Please return to the ER for any worsening symptoms, including severe headache, worsening numbness/tingling/weakness in 1 arm or 1 leg, worsened coordination, speech deficits.

## 2021-04-06 NOTE — ED Provider Notes (Signed)
Emergency Medicine Provider Triage Evaluation Note  Mario Crawford , a 30 y.o. male  was evaluated in triage.  Pt complains of numbness in the left arm distal to the elbow that started this AM upon waking. States that he has difficulty moving the left hand with associated weakness. He types frequently for work and was having a difficult time typing this AM so he went to UC who recommended he come to the ED. States he typically sleeps on his left side and has sometimes experienced similar sx in the past but never of this length. Also noted he is going through officer candidate school in the [employer] and just got back yesterday and went through significant physical stress and also notes that he had very little sleep the past few days.   Physical Exam  BP 126/80 (BP Location: Left Arm)   Pulse 69   Temp 98.6 F (37 C) (Oral)   Resp 15   SpO2 100%  Gen:   Awake, no distress   Resp:  Normal effort  MSK:   Moves extremities without difficulty  Other:  NVI in the left arm and hand. Subjective decrease in sensation.   Medical Decision Making  Medically screening exam initiated at 12:08 PM.  Appropriate orders placed.  Mario Crawford was informed that the remainder of the evaluation will be completed by another provider, this initial triage assessment does not replace that evaluation, and the importance of remaining in the ED until their evaluation is complete.   Mario Sou, PA-C 04/06/21 1221    Mario Munch, MD 04/07/21 [phone]

## 2021-04-06 NOTE — ED Triage Notes (Signed)
Pt in with c/o left arm numbness, cp, nausea and dizziness that started today when he woke up   Denies any confusion, vomiting, radiation to jaw

## 2021-04-06 NOTE — ED Notes (Signed)
Got patient undressed on the monitor into a gown patienbt is resting with call bell in reach

## 2021-04-06 NOTE — ED Notes (Signed)
Patient is being discharged from the Urgent Care and sent to the Emergency Department via pov . Per Ignacia Bayley, PA, patient is in need of higher level of care due to cp, left arm numbness lightheadedness. Patient is aware and verbalizes understanding of plan of care.  Vitals:   04/06/21 1100  BP: 128/76  Pulse: 83  Resp: 18  Temp: 98.6 F (37 C)  SpO2: 100%

## 2021-06-30 DIAGNOSIS — H6121 Impacted cerumen, right ear: Secondary | ICD-10-CM | POA: Diagnosis not present

## 2021-06-30 DIAGNOSIS — H6982 Other specified disorders of Eustachian tube, left ear: Secondary | ICD-10-CM | POA: Diagnosis not present

## 2021-06-30 DIAGNOSIS — H6592 Unspecified nonsuppurative otitis media, left ear: Secondary | ICD-10-CM | POA: Diagnosis not present

## 2021-06-30 DIAGNOSIS — H6503 Acute serous otitis media, bilateral: Secondary | ICD-10-CM | POA: Diagnosis not present

## 2021-09-16 DIAGNOSIS — J069 Acute upper respiratory infection, unspecified: Secondary | ICD-10-CM | POA: Diagnosis not present

## 2021-12-15 ENCOUNTER — Telehealth: Payer: BC Managed Care – PPO | Admitting: Physician Assistant

## 2021-12-15 DIAGNOSIS — J038 Acute tonsillitis due to other specified organisms: Secondary | ICD-10-CM | POA: Diagnosis not present

## 2021-12-15 DIAGNOSIS — B9689 Other specified bacterial agents as the cause of diseases classified elsewhere: Secondary | ICD-10-CM

## 2021-12-15 MED ORDER — DOXYCYCLINE HYCLATE 100 MG PO TABS: 100.0000 mg | ORAL_TABLET | Freq: Two times a day (BID) | ORAL | 0 refills | Status: AC

## 2021-12-15 NOTE — Progress Notes (Signed)
?Virtual Visit Consent  ? ?Lydia Guiles, you are scheduled for a virtual visit with a [facility] provider today.   ?  ?Just as with appointments in the office, your consent must be obtained to participate.  Your consent will be active for this visit and any virtual visit you may have with one of our providers in the next 365 days.   ?  ?If you have a MyChart account, a copy of this consent can be sent to you electronically.  All virtual visits are billed to your insurance company just like a traditional visit in the office.   ? ?As this is a virtual visit, video technology does not allow for your provider to perform a traditional examination.  This may limit your provider's ability to fully assess your condition.  If your provider identifies any concerns that need to be evaluated in person or the need to arrange testing (such as labs, EKG, etc.), we will make arrangements to do so.   ?  ?Although advances in technology are sophisticated, we cannot ensure that it will always work on either your end or our end.  If the connection with a video visit is poor, the visit may have to be switched to a telephone visit.  With either a video or telephone visit, we are not always able to ensure that we have a secure connection.    ? ?I need to obtain your verbal consent now.   Are you willing to proceed with your visit today?  ?  ?TALON DISHNER has provided verbal consent on 12/15/2021 for a virtual visit (video or telephone). ?  ?Mar Daring, PA-C  ? ?Date: 12/15/2021 7:20 PM ? ? ?Virtual Visit via Video Note  ? ?Reynolds Bowl, connected with  SAFIR TAULBEE  ([id number], August 27, 1991) on 12/15/21 at  7:00 PM EDT by a video-enabled telemedicine application and verified that I am speaking with the correct person using two identifiers. ? ?Location: ?Patient: Virtual Visit Location Patient: Home ?Provider: Virtual Visit Location Provider: Home Office ?  ?I discussed the limitations of evaluation and management by  telemedicine and the availability of in person appointments. The patient expressed understanding and agreed to proceed.   ? ?History of Present Illness: ?Mario Crawford is a 31 y.o. who identifies as a male who was assigned male at birth, and is being seen today for sore throat. ? ?HPI: Sore Throat  ?This is a new problem. The current episode started in the past 7 days. The problem has been gradually worsening. Maximum temperature: 99.5. The fever has been present for 1 to 2 days. The pain is moderate. Associated symptoms include congestion, coughing (slight), ear pain, headaches, a hoarse voice, swollen glands and trouble swallowing. Pertinent negatives include no ear discharge. Associated symptoms comments: nausea. He has had no exposure to strep. Exposure to: does work in [employer] and encounters many people. He has tried nothing for the symptoms. The treatment provided no relief.   ? ? ?Problems:  ?Patient Active Problem List  ? Diagnosis Date Noted  ? Right knee pain 03/03/2020  ? Left shoulder pain 03/03/2020  ? Palpitations 05/24/2011  ? Chest pain 05/24/2011  ? Anxiety 05/24/2011  ? Drug abuse ([place]) 05/24/2011  ?  ?Allergies:  ?Allergies  ?Allergen Reactions  ? Penicillins Anaphylaxis  ?  Has patient had a PCN reaction causing immediate rash, facial/tongue/throat swelling, SOB or lightheadedness with hypotension: Yes ?Has patient had a PCN reaction causing severe rash  involving mucus membranes or skin necrosis: No ?Has patient had a PCN reaction that required hospitalization: Yes ?Has patient had a PCN reaction occurring within the last 10 years: No ?If all of the above answers are "NO", then may proceed with Cephalosporin use.  ? ?Medications:  ?Current Outpatient Medications:  ?  doxycycline (VIBRA-TABS) 100 MG tablet, Take 1 tablet (100 mg total) by mouth 2 (two) times daily., Disp: 20 tablet, Rfl: 0 ? ?Observations/Objective: ?Patient is well-developed, well-nourished in no acute distress.  ?Resting  comfortably at home.  ?Head is normocephalic, atraumatic.  ?No labored breathing. ?Speech is clear and coherent with logical content.  ?Patient is alert and oriented at baseline.  ? ? ?Assessment and Plan: ?1. Acute bacterial tonsillitis ?- doxycycline (VIBRA-TABS) 100 MG tablet; Take 1 tablet (100 mg total) by mouth 2 (two) times daily.  Dispense: 20 tablet; Refill: 0 ? ?- Suspect tonsillitis ?- Doxycycline prescribed ?- Salt water gargles ?- Tylenol and Ibuprofen as needed ?- Steam treatments and humidifier ?- Push fluids ?- Rest ?- Seek in person evaluation if not improving or if symptoms worsen ? ?Follow Up Instructions: ?I discussed the assessment and treatment plan with the patient. The patient was provided an opportunity to ask questions and all were answered. The patient agreed with the plan and demonstrated an understanding of the instructions.  A copy of instructions were sent to the patient via MyChart unless otherwise noted below.  ? ? ?The patient was advised to call back or seek an in-person evaluation if the symptoms worsen or if the condition fails to improve as anticipated. ? ?Time:  ?I spent 12 minutes with the patient via telehealth technology discussing the above problems/concerns.   ? ?Mar Daring, PA-C ?

## 2021-12-15 NOTE — Patient Instructions (Signed)
?Mario Crawford, thank you for joining Mario Loveless, PA-C for today's virtual visit.  While this provider is not your primary care provider (PCP), if your PCP is located in our provider database this encounter information will be shared with them immediately following your visit. ? ?Consent: ?(Patient) Mario Crawford provided verbal consent for this virtual visit at the beginning of the encounter. ? ?Current Medications: ? ?Current Outpatient Medications:  ?  doxycycline (VIBRA-TABS) 100 MG tablet, Take 1 tablet (100 mg total) by mouth 2 (two) times daily., Disp: 20 tablet, Rfl: 0  ? ?Medications ordered in this encounter:  ?Meds ordered this encounter  ?Medications  ? doxycycline (VIBRA-TABS) 100 MG tablet  ?  Sig: Take 1 tablet (100 mg total) by mouth 2 (two) times daily.  ?  Dispense:  20 tablet  ?  Refill:  0  ?  Order Specific Question:   Supervising Provider  ?  Answer:   Eber Hong [[id number]]  ?  ? ?*If you need refills on other medications prior to your next appointment, please contact your pharmacy* ? ?Follow-Up: ?Call back or seek an in-person evaluation if the symptoms worsen or if the condition fails to improve as anticipated. ? ?Other Instructions ?Tonsillitis ?Tonsillitis is an infection of the throat that causes the tonsils to become red, tender, and swollen. Tonsils are tissues in the back of your throat. Each tonsil has crevices (crypts). Tonsils normally work to protect the body from infection. ?What are the causes? ?Sudden (acute) tonsillitis may be caused by a virus or bacteria, including streptococcal bacteria. Long-lasting (chronic) tonsillitis occurs when the crypts of the tonsils become filled with pieces of food and bacteria, which makes it easy for the tonsils to become repeatedly infected. ?Tonsillitis can be spread from person to person when it is caused by a virus or bacteria. It may be spread by inhaling droplets that are released with coughing or sneezing. You may also come into  contact with viruses or bacteria on surfaces, such as cups or utensils. ?What are the signs or symptoms? ?Symptoms of this condition include: ?A sore throat. This may include trouble swallowing. ?White patches on the tonsils. ?Swollen tonsils. ?Fever. ?Headache. ?Tiredness. ?Loss of appetite. ?Snoring during sleep when you did not snore before. ?Small, foul-smelling, yellowish-white pieces of material (tonsilloliths) that you occasionally cough up or spit out. These can cause you to have bad breath. ?How is this diagnosed? ?This condition is diagnosed with a physical exam. Diagnosis can be confirmed with the results of lab tests, including a throat culture. ?How is this treated? ?Treatment for this condition depends on the cause, but usually focuses on treating the symptoms associated with it. Treatment may include: ?Medicines to relieve pain and manage fever. ?Steroid medicines to reduce swelling. ?Antibiotic medicines if the condition is caused by bacteria. ?If episodes of tonsillitis are severe and frequent, your health care provider may recommend surgery to remove the tonsils (tonsillectomy). ?Follow these instructions at home: ?Medicines ?Take over-the-counter and prescription medicines only as told by your health care provider. ?If you were prescribed an antibiotic medicine, take it as told by your health care provider. Do not stop taking the antibiotic even if you start to feel better. ?Eating and drinking ?Drink enough fluid to keep your urine pale yellow. ?While your throat is sore, eat soft or liquid foods, such as sherbet, soups, or soft, warm cereals, such as oatmeal or hot wheat cereal. ?Drink warm liquids. ?Eat frozen ice pops. ?General instructions ?Rest  as much as possible and get plenty of sleep. ?Gargle with a mixture of salt and water 3-4 times a day or as needed. ?To make salt water, completely dissolve ?-1 tsp (3-6 g) of salt in 1 cup (237 mL) of warm water. ?Do not swallow the mixture of salt  and water. ?Wash your hands regularly with soap and water for at least 20 seconds. If soap and water are not available, use hand sanitizer. ?Do not share cups, bottles, or other utensils until your symptoms have gone away. ?Do not use any products that contain nicotine or tobacco. These products include cigarettes, chewing tobacco, and vaping devices, such as e-cigarettes. If you need help quitting, ask your health care provider. ?Keep all follow-up visits. This is important. ?Contact a health care provider if: ?You notice large, tender lumps in your neck that were not there before. ?You have a fever that does not go away after 2-3 days. ?You develop a rash. ?You cough up a green, yellow-brown, or bloody substance. ?You cannot swallow liquids or food for 24 hours. ?Only one of your tonsils is swollen. ?Get help right away if: ?You develop any new symptoms, such as vomiting, severe headache, stiff neck, chest pain, trouble breathing, or trouble swallowing. ?You have severe throat pain along with drooling or voice changes. ?You have severe pain that is not controlled with medicines. ?You cannot fully open your mouth. ?You develop redness, swelling, or severe pain anywhere in your neck. ?Summary ?Tonsillitis is an infection of the throat that causes the tonsils to become red, tender, and swollen. The most common symptom is pain in the throat. ?Tonsillitis is most often caused by a virus or bacteria. ?Get help right away if you develop any new symptoms, such as vomiting, severe headache, stiff neck, chest pain, or trouble breathing. ?This information is not intended to replace advice given to you by your health care provider. Make sure you discuss any questions you have with your health care provider. ?Document Revised: 02/05/2021 Document Reviewed: 02/05/2021 ?Elsevier Patient Education ? 2022 Elsevier Inc. ? ? ? ?If you have been instructed to have an in-person evaluation today at a local Urgent Care facility, please  use the link below. It will take you to a list of all of our available [facility], including address, phone number and hours of operation. Please do not delay care.  ?[facility] ? ?If you or a family member do not have a primary care provider, use the link below to schedule a visit and establish care. When you choose a [facility] primary care physician or advanced practice provider, you gain a long-term partner in health. ?Find a Primary Care Provider ? ?Learn more about [facility]'s in-office and virtual care options: ?[facility] - Get Care Now ?

## 2022-03-20 DIAGNOSIS — S61412A Laceration without foreign body of left hand, initial encounter: Secondary | ICD-10-CM | POA: Diagnosis not present

## 2022-03-20 DIAGNOSIS — W1830XA Fall on same level, unspecified, initial encounter: Secondary | ICD-10-CM | POA: Diagnosis not present

## 2022-03-20 DIAGNOSIS — M79642 Pain in left hand: Secondary | ICD-10-CM | POA: Diagnosis not present

## 2022-03-24 DIAGNOSIS — S66822D Laceration of other specified muscles, fascia and tendons at wrist and hand level, left hand, subsequent encounter: Secondary | ICD-10-CM | POA: Diagnosis not present

## 2022-03-30 DIAGNOSIS — T148XXA Other injury of unspecified body region, initial encounter: Secondary | ICD-10-CM | POA: Diagnosis not present

## 2022-03-30 DIAGNOSIS — L089 Local infection of the skin and subcutaneous tissue, unspecified: Secondary | ICD-10-CM | POA: Diagnosis not present

## 2022-03-30 DIAGNOSIS — R030 Elevated blood-pressure reading, without diagnosis of hypertension: Secondary | ICD-10-CM | POA: Diagnosis not present

## 2022-04-12 DIAGNOSIS — N451 Epididymitis: Secondary | ICD-10-CM | POA: Diagnosis not present

## 2022-04-17 IMAGING — CR DG CHEST 1V
1 series · 1 of 1 positions shown · non-contrast
Comparison: July 03, 2018.

CLINICAL DATA: LEFT arm numbness, chest pain in a 30-year-old male.

EXAM:
CHEST  1 VIEW

[chest pa]
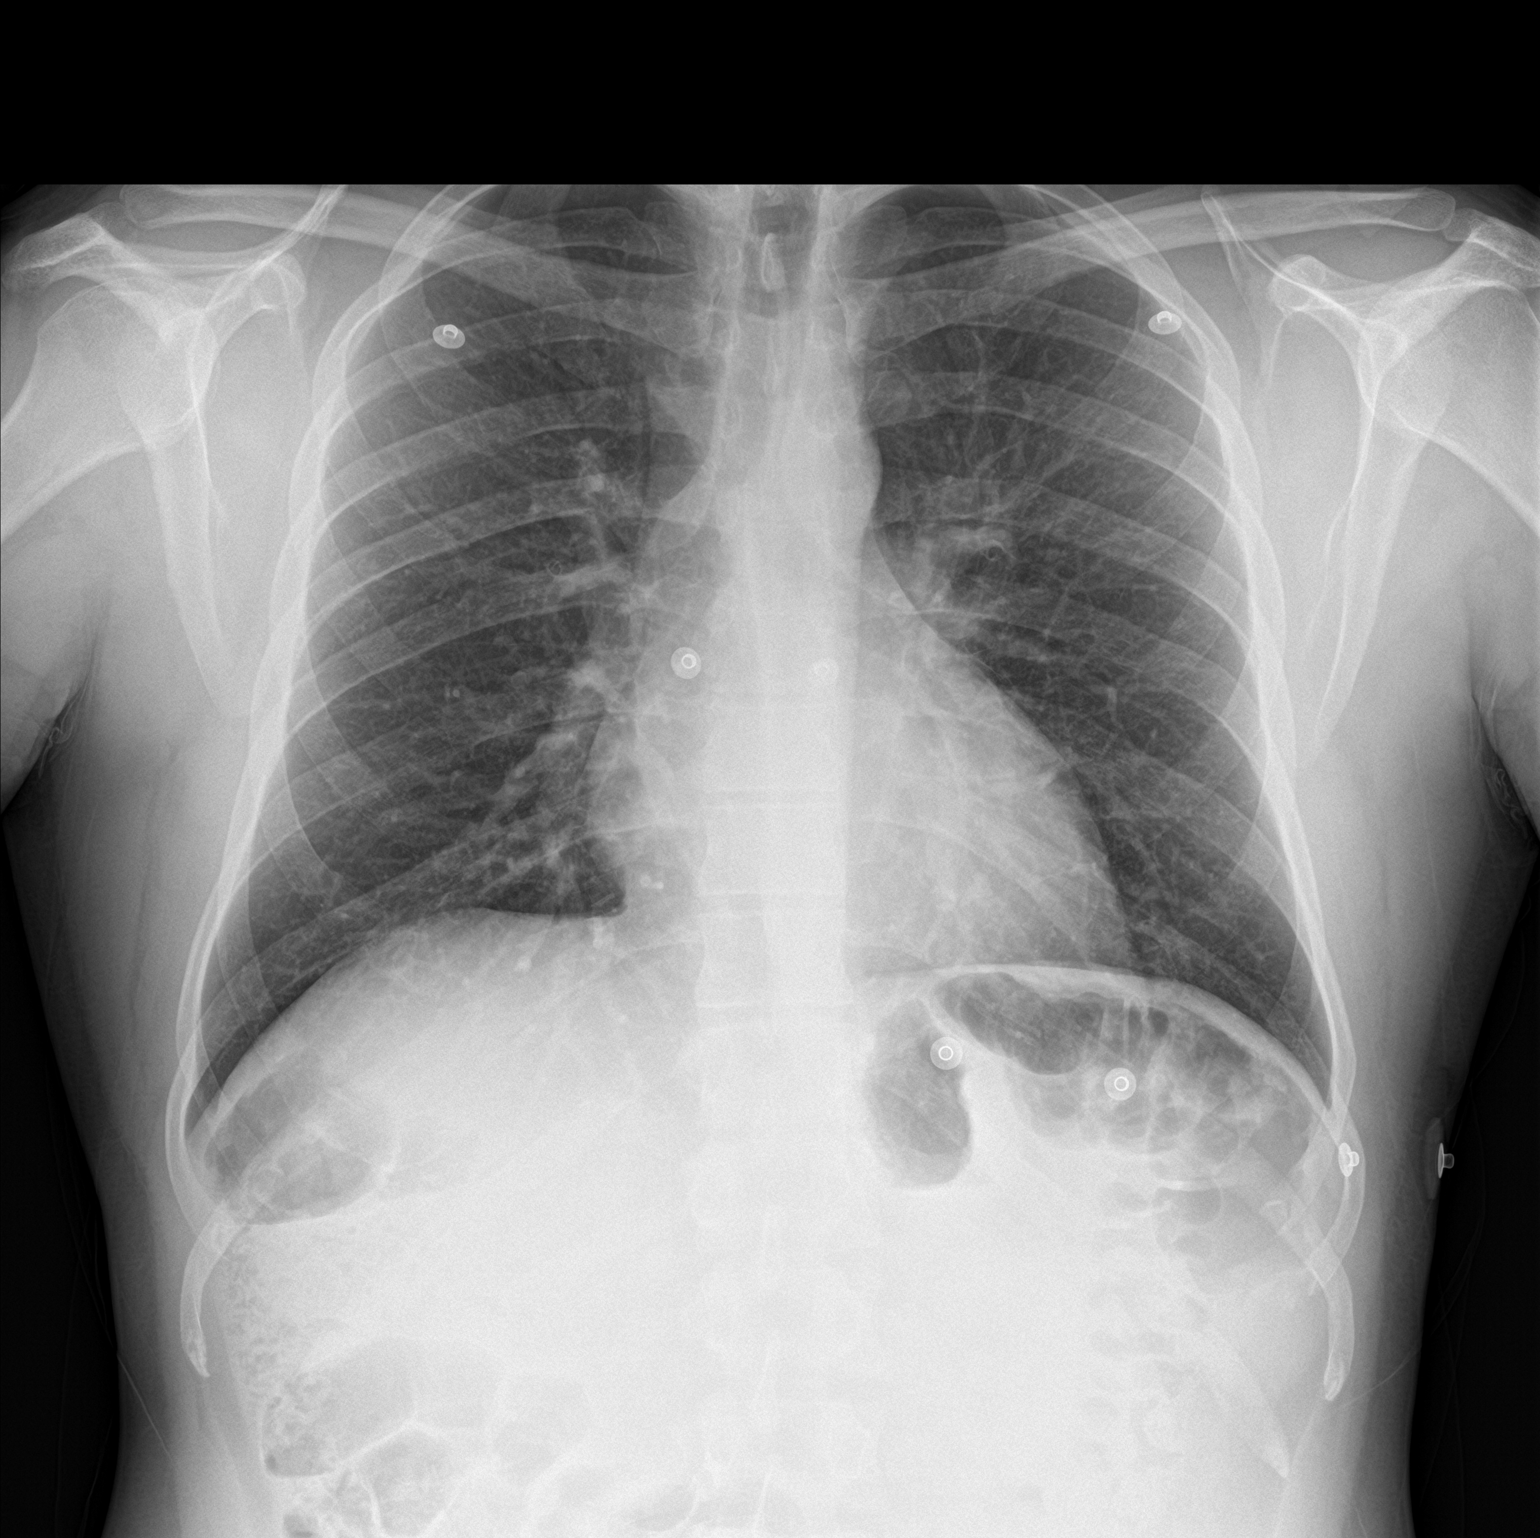

[1 of 1 positions shown; findings below may reference images not displayed]

FINDINGS: Trachea midline.

Cardiomediastinal contours and hilar structures are normal.

Lungs are clear.  No sign of effusion.  No pneumothorax.

EKG stickers project over the chest.

On limited assessment no acute skeletal process
IMPRESSION: No acute cardiopulmonary disease.

## 2022-05-25 DIAGNOSIS — J40 Bronchitis, not specified as acute or chronic: Secondary | ICD-10-CM | POA: Diagnosis not present

## 2022-05-28 DIAGNOSIS — J40 Bronchitis, not specified as acute or chronic: Secondary | ICD-10-CM | POA: Diagnosis not present

## 2022-08-03 DIAGNOSIS — Z20822 Contact with and (suspected) exposure to covid-19: Secondary | ICD-10-CM | POA: Diagnosis not present

## 2022-08-03 DIAGNOSIS — R051 Acute cough: Secondary | ICD-10-CM | POA: Diagnosis not present

## 2022-10-22 DIAGNOSIS — M25552 Pain in left hip: Secondary | ICD-10-CM | POA: Diagnosis not present

## 2023-03-28 DIAGNOSIS — M25511 Pain in right shoulder: Secondary | ICD-10-CM | POA: Diagnosis not present

## 2023-03-28 DIAGNOSIS — M542 Cervicalgia: Secondary | ICD-10-CM | POA: Diagnosis not present

## 2023-04-04 DIAGNOSIS — M25511 Pain in right shoulder: Secondary | ICD-10-CM | POA: Diagnosis not present

## 2023-11-01 DIAGNOSIS — J029 Acute pharyngitis, unspecified: Secondary | ICD-10-CM | POA: Diagnosis not present

## 2023-11-01 DIAGNOSIS — J020 Streptococcal pharyngitis: Secondary | ICD-10-CM | POA: Diagnosis not present

## 2023-11-01 DIAGNOSIS — R509 Fever, unspecified: Secondary | ICD-10-CM | POA: Diagnosis not present

## 2024-01-07 ENCOUNTER — Other Ambulatory Visit: Payer: Self-pay

## 2024-01-07 ENCOUNTER — Emergency Department (HOSPITAL_COMMUNITY)
Admission: EM | Admit: 2024-01-07 | Discharge: 2024-01-08 | Disposition: A | Discharge status: Home / Self Care | Attending: Emergency Medicine | Admitting: Emergency Medicine

## 2024-01-07 DIAGNOSIS — R1033 Periumbilical pain: Secondary | ICD-10-CM | POA: Insufficient documentation

## 2024-01-07 DIAGNOSIS — R109 Unspecified abdominal pain: Secondary | ICD-10-CM | POA: Diagnosis not present

## 2024-01-07 DIAGNOSIS — Z87891 Personal history of nicotine dependence: Secondary | ICD-10-CM | POA: Diagnosis not present

## 2024-01-07 LAB — CBC
HCT: 43.2 % (ref 39.0–52.0)
Hemoglobin: 15.2 g/dL (ref 13.0–17.0)
MCH: 31.7 pg (ref 26.0–34.0)
MCHC: 35.2 g/dL (ref 30.0–36.0)
MCV: 90.2 fL (ref 80.0–100.0)
Platelets: 228 10*3/uL (ref 150–400)
RBC: 4.79 MIL/uL (ref 4.22–5.81)
RDW: 12.2 % (ref 11.5–15.5)
WBC: 8.8 10*3/uL (ref 4.0–10.5)
nRBC: 0 % (ref 0.0–0.2)

## 2024-01-07 LAB — COMPREHENSIVE METABOLIC PANEL WITH GFR
ALT: 26 U/L (ref 0–44)
AST: 28 U/L (ref 15–41)
Albumin: 4.6 g/dL (ref 3.5–5.0)
Alkaline Phosphatase: 49 U/L (ref 38–126)
Anion gap: 8 (ref 5–15)
BUN: 15 mg/dL (ref 6–20)
CO2: 25 mmol/L (ref 22–32)
Calcium: 9.2 mg/dL (ref 8.9–10.3)
Chloride: 102 mmol/L (ref 98–111)
Creatinine, Ser: 0.89 mg/dL (ref 0.61–1.24)
GFR, Estimated: >60 mL/min (ref >60)
Glucose, Bld: 134 mg/dL — ABNORMAL HIGH (ref 70–99)
Potassium: 3.5 mmol/L (ref 3.5–5.1)
Sodium: 135 mmol/L (ref 135–145)
Total Bilirubin: 0.5 mg/dL (ref 0.0–1.2)
Total Protein: 8.4 g/dL — ABNORMAL HIGH (ref 6.5–8.1)

## 2024-01-07 LAB — URINALYSIS, ROUTINE W REFLEX MICROSCOPIC
Bilirubin Urine: NEGATIVE
Glucose, UA: NEGATIVE mg/dL
Hgb urine dipstick: NEGATIVE
Ketones, ur: NEGATIVE mg/dL
Leukocytes,Ua: NEGATIVE
Nitrite: NEGATIVE
Protein, ur: NEGATIVE mg/dL
Specific Gravity, Urine: 1.017 (ref 1.005–1.030)
pH: 7 (ref 5.0–8.0)

## 2024-01-07 NOTE — ED Triage Notes (Signed)
 Pt reports concern for a mass above his belly button that has increased in pain severity x 1 week. Pain associated with nausea. Last BM today.

## 2024-01-08 ENCOUNTER — Emergency Department (HOSPITAL_COMMUNITY)

## 2024-01-08 DIAGNOSIS — R1033 Periumbilical pain: Secondary | ICD-10-CM | POA: Diagnosis not present

## 2024-01-08 DIAGNOSIS — R109 Unspecified abdominal pain: Secondary | ICD-10-CM | POA: Diagnosis not present

## 2024-01-08 LAB — LIPASE, BLOOD: Lipase: 38 U/L (ref 11–51)

## 2024-01-08 MED ORDER — IOHEXOL 300 MG/ML  SOLN
100.0000 mL | Freq: Once | INTRAMUSCULAR | Status: AC | PRN
  Administered 2024-01-08: 100 mL via INTRAVENOUS

## 2024-01-08 NOTE — Discharge Instructions (Signed)

## 2024-01-08 NOTE — ED Provider Notes (Signed)
 Emergency Department Provider Note   I have reviewed the triage vital signs and the nursing notes.   HISTORY  Chief Complaint Abdominal Pain   HPI Mario Crawford is a 33 y.o. male with past history reviewed below presents emergency department with periumbilical pain with palpable mass according to patient.  He states he has had some discomfort in this area for the past week.  He has had an area of discomfort like this in the past but the mass has felt more soft and less painful.  No vomiting.  He continues to have bowel movements.  He notes that he did have a mild infection in his navel in the past month which he treated with topical antibiotic and symptoms resolved   Past Medical History:  Diagnosis Date   Atypical chest pain    Drug abuse (HCC)    Former smoker    Palpitations    Panic attacks     Review of Systems  Constitutional: No fever/chills Cardiovascular: Denies chest pain. Respiratory: Denies shortness of breath. Gastrointestinal: Positive periumbilical abdominal pain.  No nausea, no vomiting.  No diarrhea.  No constipation. Skin: Negative for rash. Neurological: Negative for headaches.  ____________________________________________   PHYSICAL EXAM:  VITAL SIGNS: ED Triage Vitals  Encounter Vitals Group     BP 01/07/24 2300 (!) 158/116     Pulse Rate 01/07/24 2300 80     Resp 01/07/24 2300 18     Temp 01/07/24 2300 98.1 F (36.7 C)     Temp Source 01/07/24 2300 Oral     SpO2 01/07/24 2300 99 %     Weight 01/07/24 2256 185 lb (83.9 kg)     Height 01/07/24 2256 5\' 7"  (1.702 m)   Constitutional: Alert and oriented. Well appearing and in no acute distress. Eyes: Conjunctivae are normal.  Head: Atraumatic. Nose: No congestion/rhinnorhea. Mouth/Throat: Mucous membranes are moist.   Neck: No stridor.   Cardiovascular: Normal rate, regular rhythm. Good peripheral circulation. Grossly normal heart sounds.   Respiratory: Normal respiratory effort.  No  retractions. Lungs CTAB. Gastrointestinal: Soft and nontender. No large, incarcerated umbilical hernia. No palpable abscess or cellulitis. No distention.  Musculoskeletal: No gross deformities of extremities. Neurologic:  Normal speech and language.  Skin:  Skin is warm, dry and intact. No rash noted.  ____________________________________________   LABS (all labs ordered are listed, but only abnormal results are displayed)  Labs Reviewed  COMPREHENSIVE METABOLIC PANEL WITH GFR - Abnormal; Notable for the following components:      Result Value   Glucose, Bld 134 (*)    Total Protein 8.4 (*)    All other components within normal limits  URINALYSIS, ROUTINE W REFLEX MICROSCOPIC - Abnormal; Notable for the following components:   APPearance HAZY (*)    All other components within normal limits  CBC  LIPASE, BLOOD   ____________________________________________  RADIOLOGY  CT ABDOMEN PELVIS W CONTRAST Result Date: 01/08/2024 CLINICAL DATA:  Abdominal pain EXAM: CT ABDOMEN AND PELVIS WITH CONTRAST TECHNIQUE: Multidetector CT imaging of the abdomen and pelvis was performed using the standard protocol following bolus administration of intravenous contrast. RADIATION DOSE REDUCTION: This exam was performed according to the departmental dose-optimization program which includes automated exposure control, adjustment of the mA and/or kV according to patient size and/or use of iterative reconstruction technique. CONTRAST:  100mL OMNIPAQUE IOHEXOL 300 MG/ML  SOLN COMPARISON:  None Available. FINDINGS: Lower chest: No acute abnormality Hepatobiliary: No focal hepatic abnormality. Gallbladder unremarkable. Pancreas: No  focal abnormality or ductal dilatation. Spleen: No focal abnormality.  Normal size. Adrenals/Urinary Tract: No adrenal abnormality. No focal renal abnormality. No stones or hydronephrosis. Urinary bladder is unremarkable. Stomach/Bowel: Normal appendix. Moderate stool burden throughout the  colon. Stomach, large and small bowel grossly unremarkable. Vascular/Lymphatic: No evidence of aneurysm or adenopathy. Reproductive: No visible focal abnormality. Other: No free fluid or free air. Musculoskeletal: No acute bony abnormality. IMPRESSION: Moderate stool burden throughout the colon. No acute findings. Electronically Signed   By: Janeece Mechanic M.D.   On: 01/08/2024 01:10    ____________________________________________   PROCEDURES  Procedure(s) performed:   Procedures  None  ____________________________________________   INITIAL IMPRESSION / ASSESSMENT AND PLAN / ED COURSE  Pertinent labs & imaging results that were available during my care of the patient were reviewed by me and considered in my medical decision making (see chart for details).   This patient is Presenting for Evaluation of abdominal pain, which does require a range of treatment options, and is a complaint that involves a high risk of morbidity and mortality.  The Differential Diagnoses includes but is not exclusive to hernia, abscess, acute cholecystitis, intrathoracic causes for epigastric abdominal pain, gastritis, duodenitis, pancreatitis, small bowel or large bowel obstruction, abdominal aortic aneurysm, hernia, gastritis, etc.   Clinical Laboratory Tests Ordered, included UA without infection.  LFTs, bilirubin normal.  CBC without leukocytosis.  Radiologic Tests Ordered, included CT abdomen/pelvis. I independently interpreted the images and agree with radiology interpretation.   Cardiac Monitor Tracing which shows NSR.    Social Determinants of Health Risk patient is a non-smoker.   Medical Decision Making: Summary:  Patient presents to the emergency department for evaluation of abdominal pain and concern for tender, mass in the umbilicus.  Not appreciate a large, obviously incarcerated hernia.  No overlying cellulitis or obvious abscess.  Patient does have some tenderness in this area.  Plan for CT  abdomen pelvis to evaluate for hernia.   Reevaluation with update and discussion with patient. Discussed CT findings. Plan for supportive care at home with PCP follow up.   Patient's presentation is most consistent with acute presentation with potential threat to life or bodily function.   Disposition: discharge  ____________________________________________  FINAL CLINICAL IMPRESSION(S) / ED DIAGNOSES  Final diagnoses:  Periumbilical abdominal pain    Note:  This document was prepared using Dragon voice recognition software and may include unintentional dictation errors.  Abby Hocking, MD, [facility] Emergency Medicine    Syvanna Ciolino, Shereen Dike, MD 01/08/24 [phone]

## 2024-01-16 DIAGNOSIS — J020 Streptococcal pharyngitis: Secondary | ICD-10-CM | POA: Diagnosis not present

## 2024-01-16 DIAGNOSIS — F1090 Alcohol use, unspecified, uncomplicated: Secondary | ICD-10-CM | POA: Diagnosis not present

## 2024-01-16 DIAGNOSIS — R739 Hyperglycemia, unspecified: Secondary | ICD-10-CM | POA: Diagnosis not present

## 2024-01-16 DIAGNOSIS — R030 Elevated blood-pressure reading, without diagnosis of hypertension: Secondary | ICD-10-CM | POA: Diagnosis not present

## 2024-06-13 ENCOUNTER — Telehealth: Admitting: Physician Assistant

## 2024-06-13 DIAGNOSIS — U071 COVID-19: Secondary | ICD-10-CM | POA: Diagnosis not present

## 2024-06-13 MED ORDER — NIRMATRELVIR/RITONAVIR (PAXLOVID)TABLET: 3.0000 | ORAL_TABLET | Freq: Two times a day (BID) | ORAL | 0 refills | Status: AC

## 2024-06-13 MED ORDER — PSEUDOEPH-BROMPHEN-DM 30-2-10 MG/5ML PO SYRP: 5.0000 mL | ORAL_SOLUTION | Freq: Four times a day (QID) | ORAL | 0 refills | Status: AC | PRN

## 2024-06-13 NOTE — Patient Instructions (Signed)
 Mario Crawford, thank you for joining Delon CHRISTELLA Dickinson, PA-C for today's virtual visit.  While this provider is not your primary care provider (PCP), if your PCP is located in our provider database this encounter information will be shared with them immediately following your visit.   A [facility] MyChart account gives you access to today's visit and all your visits, tests, and labs performed at [facility]  click here if you don't have a [facility] MyChart account or go to [email or web address]  Consent: (Patient) Mario Crawford provided verbal consent for this virtual visit at the beginning of the encounter.  Current Medications:  Current Outpatient Medications:    brompheniramine-pseudoephedrine-DM 30-2-10 MG/5ML syrup, Take 5 mLs by mouth 4 (four) times daily as needed., Disp: 120 mL, Rfl: 0   nirmatrelvir /ritonavir  (PAXLOVID ) 20 x 150 MG & 10 x 100MG  TABS, Take 3 tablets by mouth 2 (two) times daily for 5 days. (Take nirmatrelvir  150 mg two tablets twice daily for 5 days and ritonavir  100 mg one tablet twice daily for 5 days) Patient GFR is greater than 60, Disp: 30 tablet, Rfl: 0   doxycycline  (VIBRA -TABS) 100 MG tablet, Take 1 tablet (100 mg total) by mouth 2 (two) times daily., Disp: 20 tablet, Rfl: 0   Medications ordered in this encounter:  Meds ordered this encounter  Medications   nirmatrelvir /ritonavir  (PAXLOVID ) 20 x 150 MG & 10 x 100MG  TABS    Sig: Take 3 tablets by mouth 2 (two) times daily for 5 days. (Take nirmatrelvir  150 mg two tablets twice daily for 5 days and ritonavir  100 mg one tablet twice daily for 5 days) Patient GFR is greater than 60    Dispense:  30 tablet    Refill:  0    Supervising Provider:   [name] [[id number]]   brompheniramine-pseudoephedrine-DM 30-2-10 MG/5ML syrup    Sig: Take 5 mLs by mouth 4 (four) times daily as needed.    Dispense:  120 mL    Refill:  0    Supervising Provider:   [name] [[id number]]      *If you need refills on other medications prior to your next appointment, please contact your pharmacy*  Follow-Up: Call back or seek an in-person evaluation if the symptoms worsen or if the condition fails to improve as anticipated.  [facility] [phone]  Care Instructions: Can take to lessen severity (if able): Vit C 500mg  twice daily Quercertin 250-500mg  twice daily Zinc 75-100mg  daily Melatonin 3-6 mg at bedtime Vit D3 1000-2000 IU daily Aspirin 81 mg daily with food Optional: Famotidine 20mg  daily Also can add tylenol /ibuprofen as needed for fevers and body aches May add Mucinex or Mucinex DM as needed for cough/congestion   Isolation Instructions: You are to isolate at home until you have been fever free for at least 24 hours without a fever-reducing medication, and symptoms have been steadily improving for 24 hours. At that time,  you can end isolation but need to mask for an additional 5 days.   If you must be around other household members who do not have symptoms, you need to make sure that both you and the family members are masking consistently with a high-quality mask.  If you note any worsening of symptoms despite treatment, please seek an in-person evaluation ASAP. If you note any significant shortness of breath or any chest pain, please seek ER evaluation. Please do not delay care!   COVID-19: What to Do if  You Are Sick If you test positive and are an older adult or someone who is at high risk of getting very sick from COVID-19, treatment may be available. Contact a healthcare provider right away after a positive test to determine if you are eligible, even if your symptoms are mild right now. You can also visit a Test to Treat location and, if eligible, receive a prescription from a provider. Don't delay: Treatment must be started within the first few days to be effective. If you have a fever, cough, or other symptoms, you might have COVID-19. Most  people have mild illness and are able to recover at home. If you are sick: Keep track of your symptoms. If you have an emergency warning sign (including trouble breathing), call 911. Steps to help prevent the spread of COVID-19 if you are sick If you are sick with COVID-19 or think you might have COVID-19, follow the steps below to care for yourself and to help protect other people in your home and community. Stay home except to get medical care Stay home. Most people with COVID-19 have mild illness and can recover at home without medical care. Do not leave your home, except to get medical care. Do not visit public areas and do not go to places where you are unable to wear a mask. Take care of yourself. Get rest and stay hydrated. Take over-the-counter medicines, such as acetaminophen , to help you feel better. Stay in touch with your doctor. Call before you get medical care. Be sure to get care if you have trouble breathing, or have any other emergency warning signs, or if you think it is an emergency. Avoid public transportation, ride-sharing, or taxis if possible. Get tested If you have symptoms of COVID-19, get tested. While waiting for test results, stay away from others, including staying apart from those living in your household. Get tested as soon as possible after your symptoms start. Treatments may be available for people with COVID-19 who are at risk for becoming very sick. Don't delay: Treatment must be started early to be effective--some treatments must begin within 5 days of your first symptoms. Contact your healthcare provider right away if your test result is positive to determine if you are eligible. Self-tests are one of several options for testing for the virus that causes COVID-19 and may be more convenient than laboratory-based tests and point-of-care tests. Ask your healthcare provider or your local health department if you need help interpreting your test results. You can visit  your state, tribal, local, and territorial health department's website to look for the latest local information on testing sites. Separate yourself from other people As much as possible, stay in a specific room and away from other people and pets in your home. If possible, you should use a separate bathroom. If you need to be around other people or animals in or outside of the home, wear a well-fitting mask. Tell your close contacts that they may have been exposed to COVID-19. An infected person can spread COVID-19 starting 48 hours (or 2 days) before the person has any symptoms or tests positive. By letting your close contacts know they may have been exposed to COVID-19, you are helping to protect everyone. See COVID-19 and Animals if you have questions about pets. If you are diagnosed with COVID-19, someone from the health department may call you. Answer the call to slow the spread. Monitor your symptoms Symptoms of COVID-19 include fever, cough, or other symptoms. Follow care instructions  from your healthcare provider and local health department. Your local health authorities may give instructions on checking your symptoms and reporting information. When to seek emergency medical attention Look for emergency warning signs* for COVID-19. If someone is showing any of these signs, seek emergency medical care immediately: Trouble breathing Persistent pain or pressure in the chest New confusion Inability to wake or stay awake Pale, gray, or blue-colored skin, lips, or nail beds, depending on skin tone *This list is not all possible symptoms. Please call your medical provider for any other symptoms that are severe or concerning to you. Call 911 or call ahead to your local emergency facility: Notify the operator that you are seeking care for someone who has or may have COVID-19. Call ahead before visiting your doctor Call ahead. Many medical visits for routine care are being postponed or done by phone  or telemedicine. If you have a medical appointment that cannot be postponed, call your doctor's office, and tell them you have or may have COVID-19. This will help the office protect themselves and other patients. If you are sick, wear a well-fitting mask You should wear a mask if you must be around other people or animals, including pets (even at home). Wear a mask with the best fit, protection, and comfort for you. You don't need to wear the mask if you are alone. If you can't put on a mask (because of trouble breathing, for example), cover your coughs and sneezes in some other way. Try to stay at least 6 feet away from other people. This will help protect the people around you. Masks should not be placed on young children under age 63 years, anyone who has trouble breathing, or anyone who is not able to remove the mask without help. Cover your coughs and sneezes Cover your mouth and nose with a tissue when you cough or sneeze. Throw away used tissues in a lined trash can. Immediately wash your hands with soap and water for at least 20 seconds. If soap and water are not available, clean your hands with an alcohol-based hand sanitizer that contains at least 60% alcohol. Clean your hands often Wash your hands often with soap and water for at least 20 seconds. This is especially important after blowing your nose, coughing, or sneezing; going to the bathroom; and before eating or preparing food. Use hand sanitizer if soap and water are not available. Use an alcohol-based hand sanitizer with at least 60% alcohol, covering all surfaces of your hands and rubbing them together until they feel dry. Soap and water are the best option, especially if hands are visibly dirty. Avoid touching your eyes, nose, and mouth with unwashed hands. Handwashing Tips Avoid sharing personal household items Do not share dishes, drinking glasses, cups, eating utensils, towels, or bedding with other people in your home. Wash  these items thoroughly after using them with soap and water or put in the dishwasher. Clean surfaces in your home regularly Clean and disinfect high-touch surfaces (for example, doorknobs, tables, handles, light switches, and countertops) in your sick room and bathroom. In shared spaces, you should clean and disinfect surfaces and items after each use by the person who is ill. If you are sick and cannot clean, a caregiver or other person should only clean and disinfect the area around you (such as your bedroom and bathroom) on an as needed basis. Your caregiver/other person should wait as long as possible (at least several hours) and wear a mask before entering, cleaning,  and disinfecting shared spaces that you use. Clean and disinfect areas that may have blood, stool, or body fluids on them. Use household cleaners and disinfectants. Clean visible dirty surfaces with household cleaners containing soap or detergent. Then, use a household disinfectant. Use a product from [employer] List N: Disinfectants for Coronavirus (COVID-19). Be sure to follow the instructions on the label to ensure safe and effective use of the product. Many products recommend keeping the surface wet with a disinfectant for a certain period of time (look at contact time on the product label). You may also need to wear personal protective equipment, such as gloves, depending on the directions on the product label. Immediately after disinfecting, wash your hands with soap and water for 20 seconds. For completed guidance on cleaning and disinfecting your home, visit Complete Disinfection Guidance. Take steps to improve ventilation at home Improve ventilation (air flow) at home to help prevent from spreading COVID-19 to other people in your household. Clear out COVID-19 virus particles in the air by opening windows, using air filters, and turning on fans in your home. Use this interactive tool to learn how to improve air flow in your  home. When you can be around others after being sick with COVID-19 Deciding when you can be around others is different for different situations. Find out when you can safely end home isolation. For any additional questions about your care, contact your healthcare provider or state or local health department. 12/16/2020 Content source: [facility] ([facility]), Division of Viral Diseases This information is not intended to replace advice given to you by your health care provider. Make sure you discuss any questions you have with your health care provider. Document Revised: 01/29/2021 Document Reviewed: 01/29/2021 Elsevier Patient Education  2022 [employer].     If you have been instructed to have an in-person evaluation today at a local Urgent Care facility, please use the link below. It will take you to a list of all of our available [facility], including address, phone number and hours of operation. Please do not delay care.  [facility]  If you or a family member do not have a primary care provider, use the link below to schedule a visit and establish care. When you choose a [facility] primary care physician or advanced practice provider, you gain a long-term partner in health. Find a Primary Care Provider  Learn more about [facility] in-office and virtual care options: [place] - Get Care Now

## 2024-06-13 NOTE — Progress Notes (Signed)
 Virtual Visit Consent   Mario Crawford, you are scheduled for a virtual visit with a [facility] provider today. Just as with appointments in the office, your consent must be obtained to participate. Your consent will be active for this visit and any virtual visit you may have with one of our providers in the next 365 days. If you have a MyChart account, a copy of this consent can be sent to you electronically.  As this is a virtual visit, video technology does not allow for your provider to perform a traditional examination. This may limit your provider's ability to fully assess your condition. If your provider identifies any concerns that need to be evaluated in person or the need to arrange testing (such as labs, EKG, etc.), we will make arrangements to do so. Although advances in technology are sophisticated, we cannot ensure that it will always work on either your end or our end. If the connection with a video visit is poor, the visit may have to be switched to a telephone visit. With either a video or telephone visit, we are not always able to ensure that we have a secure connection.  By engaging in this virtual visit, you consent to the provision of healthcare and authorize for your insurance to be billed (if applicable) for the services provided during this visit. Depending on your insurance coverage, you may receive a charge related to this service.  I need to obtain your verbal consent now. Are you willing to proceed with your visit today? Mario Crawford has provided verbal consent on 06/13/2024 for a virtual visit (video or telephone). Delon CHRISTELLA Dickinson, PA-C  Date: 06/13/2024 2:09 PM   Virtual Visit via Video Note   I, Delon CHRISTELLA Dickinson, connected with  Mario Crawford  ([id number], 09-29-1990) on 06/13/24 at  2:00 PM EDT by a video-enabled telemedicine application and verified that I am speaking with the correct person using two identifiers.  Location: Patient: Virtual Visit Location  Patient: Home Provider: Virtual Visit Location Provider: Home Office   I discussed the limitations of evaluation and management by telemedicine and the availability of in person appointments. The patient expressed understanding and agreed to proceed.    History of Present Illness: Mario Crawford is a 33 y.o. who identifies as a male who was assigned male at birth, and is being seen today for Covid 76.  HPI: URI  This is a new problem. The current episode started in the past 7 days (Tested positive for Covid twice on at home test x 2; Symptoms started late Sunday night into Early monday). The problem has been gradually worsening. There has been no fever. Associated symptoms include congestion, coughing, diarrhea, headaches, a plugged ear sensation, rhinorrhea (and post nasal drainage- both mild), sinus pain, a sore throat and swollen glands. Pertinent negatives include no chest pain, ear pain, nausea, vomiting or wheezing. He has tried nothing for the symptoms. The treatment provided no relief.     Problems:  Patient Active Problem List   Diagnosis Date Noted   Right knee pain 03/03/2020   Left shoulder pain 03/03/2020   Palpitations 05/24/2011   Chest pain 05/24/2011   Anxiety 05/24/2011   Drug abuse (HCC) 05/24/2011    Allergies:  Allergies  Allergen Reactions   Penicillins Anaphylaxis    Has patient had a PCN reaction causing immediate rash, facial/tongue/throat swelling, SOB or lightheadedness with hypotension: Yes Has patient had a PCN reaction causing severe rash involving mucus membranes  or skin necrosis: No Has patient had a PCN reaction that required hospitalization: Yes Has patient had a PCN reaction occurring within the last 10 years: No If all of the above answers are NO, then may proceed with Cephalosporin use.   Medications:  Current Outpatient Medications:    brompheniramine-pseudoephedrine-DM 30-2-10 MG/5ML syrup, Take 5 mLs by mouth 4 (four) times daily as needed.,  Disp: 120 mL, Rfl: 0   nirmatrelvir /ritonavir  (PAXLOVID ) 20 x 150 MG & 10 x 100MG  TABS, Take 3 tablets by mouth 2 (two) times daily for 5 days. (Take nirmatrelvir  150 mg two tablets twice daily for 5 days and ritonavir  100 mg one tablet twice daily for 5 days) Patient GFR is greater than 60, Disp: 30 tablet, Rfl: 0   doxycycline  (VIBRA -TABS) 100 MG tablet, Take 1 tablet (100 mg total) by mouth 2 (two) times daily., Disp: 20 tablet, Rfl: 0  Observations/Objective: Patient is well-developed, well-nourished in no acute distress.  Resting comfortably at home.  Head is normocephalic, atraumatic.  No labored breathing.  Speech is clear and coherent with logical content.  Patient is alert and oriented at baseline.    Assessment and Plan: 1. COVID (Primary) - nirmatrelvir /ritonavir  (PAXLOVID ) 20 x 150 MG & 10 x 100MG  TABS; Take 3 tablets by mouth 2 (two) times daily for 5 days. (Take nirmatrelvir  150 mg two tablets twice daily for 5 days and ritonavir  100 mg one tablet twice daily for 5 days) Patient GFR is greater than 60  Dispense: 30 tablet; Refill: 0 - brompheniramine-pseudoephedrine-DM 30-2-10 MG/5ML syrup; Take 5 mLs by mouth 4 (four) times daily as needed.  Dispense: 120 mL; Refill: 0  - Continue OTC symptomatic management of choice - Will send OTC vitamins and supplement information through AVS - Paxlovid  prescribed - Bromfed DM for cough - Push fluids - Rest as needed - Discussed return precautions and when to seek in-person evaluation, sent via AVS as well   Follow Up Instructions: I discussed the assessment and treatment plan with the patient. The patient was provided an opportunity to ask questions and all were answered. The patient agreed with the plan and demonstrated an understanding of the instructions.  A copy of instructions were sent to the patient via MyChart unless otherwise noted below.    The patient was advised to call back or seek an in-person evaluation if the  symptoms worsen or if the condition fails to improve as anticipated.    Delon CHRISTELLA Dickinson, PA-C
